# Patient Record
Sex: Female | Born: 1945 | Race: White | Hispanic: No | Smoking: Former smoker
Health system: Southern US, Community
[De-identification: ages and names within clinical notes are randomized; demographics above are authoritative.]

## PROBLEM LIST (undated history)

## (undated) DIAGNOSIS — M069 Rheumatoid arthritis, unspecified: Secondary | ICD-10-CM

## (undated) DIAGNOSIS — J849 Interstitial pulmonary disease, unspecified: Secondary | ICD-10-CM

## (undated) DIAGNOSIS — J8410 Pulmonary fibrosis, unspecified: Secondary | ICD-10-CM

## (undated) HISTORY — PX: TONSILLECTOMY: SUR1361

## (undated) HISTORY — PX: REPLACEMENT TOTAL KNEE BILATERAL: SUR1225

## (undated) HISTORY — PX: OTHER SURGICAL HISTORY: SHX169

---

## 2018-05-03 ENCOUNTER — Inpatient Hospital Stay
Admission: EM | Admit: 2018-05-03 | Discharge: 2018-05-07 | DRG: 813 | Disposition: A | Discharge status: Other Acute Inpatient Hospital | Payer: Medicare HMO | Attending: Internal Medicine | Admitting: Internal Medicine

## 2018-05-03 ENCOUNTER — Inpatient Hospital Stay: Payer: Medicare HMO

## 2018-05-03 ENCOUNTER — Other Ambulatory Visit: Payer: Self-pay

## 2018-05-03 ENCOUNTER — Emergency Department: Payer: Medicare HMO

## 2018-05-03 ENCOUNTER — Encounter: Payer: Self-pay | Admitting: Emergency Medicine

## 2018-05-03 DIAGNOSIS — R5383 Other fatigue: Secondary | ICD-10-CM | POA: Diagnosis not present

## 2018-05-03 DIAGNOSIS — J189 Pneumonia, unspecified organism: Secondary | ICD-10-CM | POA: Diagnosis present

## 2018-05-03 DIAGNOSIS — J8489 Other specified interstitial pulmonary diseases: Secondary | ICD-10-CM | POA: Diagnosis present

## 2018-05-03 DIAGNOSIS — D696 Thrombocytopenia, unspecified: Secondary | ICD-10-CM

## 2018-05-03 DIAGNOSIS — J984 Other disorders of lung: Secondary | ICD-10-CM

## 2018-05-03 DIAGNOSIS — J9601 Acute respiratory failure with hypoxia: Secondary | ICD-10-CM | POA: Diagnosis present

## 2018-05-03 DIAGNOSIS — R042 Hemoptysis: Secondary | ICD-10-CM | POA: Diagnosis present

## 2018-05-03 DIAGNOSIS — D649 Anemia, unspecified: Secondary | ICD-10-CM | POA: Diagnosis present

## 2018-05-03 DIAGNOSIS — M069 Rheumatoid arthritis, unspecified: Secondary | ICD-10-CM | POA: Diagnosis present

## 2018-05-03 DIAGNOSIS — Z8701 Personal history of pneumonia (recurrent): Secondary | ICD-10-CM | POA: Diagnosis not present

## 2018-05-03 DIAGNOSIS — E538 Deficiency of other specified B group vitamins: Secondary | ICD-10-CM

## 2018-05-03 DIAGNOSIS — D693 Immune thrombocytopenic purpura: Secondary | ICD-10-CM

## 2018-05-03 DIAGNOSIS — R918 Other nonspecific abnormal finding of lung field: Secondary | ICD-10-CM | POA: Diagnosis not present

## 2018-05-03 DIAGNOSIS — Z87891 Personal history of nicotine dependence: Secondary | ICD-10-CM | POA: Diagnosis not present

## 2018-05-03 DIAGNOSIS — J8410 Pulmonary fibrosis, unspecified: Secondary | ICD-10-CM | POA: Diagnosis present

## 2018-05-03 DIAGNOSIS — F418 Other specified anxiety disorders: Secondary | ICD-10-CM | POA: Diagnosis present

## 2018-05-03 DIAGNOSIS — K219 Gastro-esophageal reflux disease without esophagitis: Secondary | ICD-10-CM | POA: Diagnosis present

## 2018-05-03 DIAGNOSIS — R05 Cough: Secondary | ICD-10-CM | POA: Diagnosis not present

## 2018-05-03 DIAGNOSIS — R059 Cough, unspecified: Secondary | ICD-10-CM

## 2018-05-03 DIAGNOSIS — R531 Weakness: Secondary | ICD-10-CM | POA: Diagnosis not present

## 2018-05-03 DIAGNOSIS — G2581 Restless legs syndrome: Secondary | ICD-10-CM | POA: Diagnosis present

## 2018-05-03 DIAGNOSIS — Z8042 Family history of malignant neoplasm of prostate: Secondary | ICD-10-CM | POA: Diagnosis not present

## 2018-05-03 DIAGNOSIS — R21 Rash and other nonspecific skin eruption: Secondary | ICD-10-CM

## 2018-05-03 DIAGNOSIS — Z791 Long term (current) use of non-steroidal anti-inflammatories (NSAID): Secondary | ICD-10-CM

## 2018-05-03 DIAGNOSIS — Z8051 Family history of malignant neoplasm of kidney: Secondary | ICD-10-CM

## 2018-05-03 DIAGNOSIS — Z79899 Other long term (current) drug therapy: Secondary | ICD-10-CM | POA: Diagnosis not present

## 2018-05-03 DIAGNOSIS — Z96653 Presence of artificial knee joint, bilateral: Secondary | ICD-10-CM | POA: Diagnosis present

## 2018-05-03 HISTORY — DX: Rheumatoid arthritis, unspecified: M06.9

## 2018-05-03 HISTORY — DX: Interstitial pulmonary disease, unspecified: J84.9

## 2018-05-03 HISTORY — DX: Pulmonary fibrosis, unspecified: J84.10

## 2018-05-03 LAB — COMPREHENSIVE METABOLIC PANEL
ALBUMIN: 4 g/dL (ref 3.5–5.0)
ALK PHOS: 71 U/L (ref 38–126)
ALT: 20 U/L (ref 14–54)
ANION GAP: 6 (ref 5–15)
AST: 28 U/L (ref 15–41)
BUN: 17 mg/dL (ref 6–20)
CALCIUM: 8.5 mg/dL — AB (ref 8.9–10.3)
CO2: 22 mmol/L (ref 22–32)
Chloride: 113 mmol/L — ABNORMAL HIGH (ref 101–111)
Creatinine, Ser: 0.83 mg/dL (ref 0.44–1.00)
GFR calc Af Amer: >60 mL/min (ref >60)
GFR calc non Af Amer: >60 mL/min (ref >60)
GLUCOSE: 115 mg/dL — AB (ref 65–99)
Potassium: 3.8 mmol/L (ref 3.5–5.1)
SODIUM: 141 mmol/L (ref 135–145)
Total Bilirubin: 0.7 mg/dL (ref 0.3–1.2)
Total Protein: 6.3 g/dL — ABNORMAL LOW (ref 6.5–8.1)

## 2018-05-03 LAB — CBC WITH DIFFERENTIAL/PLATELET
BASOS ABS: 0 10*3/uL (ref 0–0.1)
Basophils Relative: 1 %
Eosinophils Absolute: 0 10*3/uL (ref 0–0.7)
Eosinophils Relative: 0 %
HEMATOCRIT: 37.4 % (ref 35.0–47.0)
Hemoglobin: 12.5 g/dL (ref 12.0–16.0)
LYMPHS PCT: 16 %
Lymphs Abs: 1.6 10*3/uL (ref 1.0–3.6)
MCH: 29.6 pg (ref 26.0–34.0)
MCHC: 33.5 g/dL (ref 32.0–36.0)
MCV: 88.5 fL (ref 80.0–100.0)
MONO ABS: 0.7 10*3/uL (ref 0.2–0.9)
Monocytes Relative: 7 %
NEUTROS ABS: 7.7 10*3/uL — AB (ref 1.4–6.5)
Neutrophils Relative %: 76 %
Platelets: <5 10*3/uL — CL (ref 150–440)
RBC: 4.22 MIL/uL (ref 3.80–5.20)
RDW: 13.9 % (ref 11.5–14.5)
WBC: 10.1 10*3/uL (ref 3.6–11.0)

## 2018-05-03 LAB — APTT: aPTT: 27 seconds (ref 24–36)

## 2018-05-03 LAB — PROTIME-INR
INR: 0.97
Prothrombin Time: 12.8 seconds (ref 11.4–15.2)

## 2018-05-03 LAB — TYPE AND SCREEN
ABO/RH(D): O POS
Antibody Screen: NEGATIVE

## 2018-05-03 LAB — TECHNOLOGIST SMEAR REVIEW

## 2018-05-03 MED ORDER — PANTOPRAZOLE SODIUM 40 MG PO TBEC
40.0000 mg | DELAYED_RELEASE_TABLET | Freq: Every day | ORAL | Status: DC
  Administered 2018-05-04 – 2018-05-06 (×3): 40 mg via ORAL
  Filled 2018-05-03 (×3): qty 1

## 2018-05-03 MED ORDER — ONDANSETRON HCL 4 MG/2ML IJ SOLN: 4.0000 mg | Freq: Four times a day (QID) | INTRAMUSCULAR | Status: DC | PRN

## 2018-05-03 MED ORDER — ROPINIROLE HCL 1 MG PO TABS
4.0000 mg | ORAL_TABLET | Freq: Every day | ORAL | Status: DC
  Administered 2018-05-03 – 2018-05-06 (×4): 4 mg via ORAL
  Filled 2018-05-03 (×4): qty 4

## 2018-05-03 MED ORDER — TRAZODONE HCL 50 MG PO TABS
50.0000 mg | ORAL_TABLET | Freq: Every day | ORAL | Status: DC
  Administered 2018-05-03 – 2018-05-06 (×4): 50 mg via ORAL
  Filled 2018-05-03 (×5): qty 1

## 2018-05-03 MED ORDER — SODIUM CHLORIDE 0.9% FLUSH: 3.0000 mL | INTRAVENOUS | Status: DC | PRN

## 2018-05-03 MED ORDER — SODIUM CHLORIDE 0.9 % IV SOLN
10.0000 mL/h | Freq: Once | INTRAVENOUS | Status: AC
  Administered 2018-05-03: 16:00:00 10 mL/h via INTRAVENOUS

## 2018-05-03 MED ORDER — IOHEXOL 350 MG/ML SOLN
75.0000 mL | Freq: Once | INTRAVENOUS | Status: AC | PRN
  Administered 2018-05-03: 75 mL via INTRAVENOUS

## 2018-05-03 MED ORDER — ACETAMINOPHEN 325 MG PO TABS
650.0000 mg | ORAL_TABLET | Freq: Four times a day (QID) | ORAL | Status: DC | PRN
Start: 2018-05-03 — End: 2018-05-07
  Administered 2018-05-05 – 2018-05-06 (×2): 650 mg via ORAL
  Filled 2018-05-03 (×2): qty 2

## 2018-05-03 MED ORDER — GABAPENTIN 600 MG PO TABS
600.0000 mg | ORAL_TABLET | Freq: Two times a day (BID) | ORAL | Status: DC
  Administered 2018-05-03 – 2018-05-06 (×7): 600 mg via ORAL
  Filled 2018-05-03 (×7): qty 1

## 2018-05-03 MED ORDER — DULOXETINE HCL 30 MG PO CPEP
60.0000 mg | ORAL_CAPSULE | Freq: Every day | ORAL | Status: DC
  Administered 2018-05-04 – 2018-05-06 (×3): 60 mg via ORAL
  Filled 2018-05-03 (×3): qty 2

## 2018-05-03 MED ORDER — ALBUTEROL SULFATE (2.5 MG/3ML) 0.083% IN NEBU: 2.5000 mg | INHALATION_SOLUTION | RESPIRATORY_TRACT | Status: DC | PRN

## 2018-05-03 MED ORDER — TRAMADOL HCL 50 MG PO TABS: 50.0000 mg | ORAL_TABLET | Freq: Four times a day (QID) | ORAL | Status: DC | PRN

## 2018-05-03 MED ORDER — ONDANSETRON HCL 4 MG PO TABS
4.0000 mg | ORAL_TABLET | Freq: Four times a day (QID) | ORAL | Status: DC | PRN
Start: 2018-05-03 — End: 2018-05-07

## 2018-05-03 MED ORDER — SODIUM CHLORIDE 0.9 % IV SOLN
250.0000 mL | INTRAVENOUS | Status: DC | PRN
  Administered 2018-05-03: 250 mL via INTRAVENOUS

## 2018-05-03 MED ORDER — SODIUM CHLORIDE 0.9% FLUSH
3.0000 mL | Freq: Two times a day (BID) | INTRAVENOUS | Status: DC
  Administered 2018-05-03 – 2018-05-06 (×8): 3 mL via INTRAVENOUS

## 2018-05-03 MED ORDER — IMMUNE GLOBULIN (HUMAN) 20 GM/200ML IV SOLN
90.0000 g | INTRAVENOUS | Status: DC
  Filled 2018-05-03: qty 100

## 2018-05-03 MED ORDER — IMMUNE GLOBULIN (HUMAN) 10 GM/100ML IV SOLN
1.0000 g/kg | INTRAVENOUS | Status: DC
  Filled 2018-05-03: qty 1000

## 2018-05-03 MED ORDER — BUPROPION HCL ER (XL) 300 MG PO TB24
300.0000 mg | ORAL_TABLET | Freq: Every day | ORAL | Status: DC
Start: 2018-05-04 — End: 2018-05-07
  Administered 2018-05-04 – 2018-05-06 (×3): 300 mg via ORAL
  Filled 2018-05-03 (×4): qty 1

## 2018-05-03 MED ORDER — SENNOSIDES-DOCUSATE SODIUM 8.6-50 MG PO TABS: 1.0000 | ORAL_TABLET | Freq: Every evening | ORAL | Status: DC | PRN

## 2018-05-03 MED ORDER — ACETAMINOPHEN 650 MG RE SUPP
650.0000 mg | Freq: Four times a day (QID) | RECTAL | Status: DC | PRN
Start: 2018-05-03 — End: 2018-05-07

## 2018-05-03 MED ORDER — BISACODYL 5 MG PO TBEC
5.0000 mg | DELAYED_RELEASE_TABLET | Freq: Every day | ORAL | Status: DC | PRN
  Administered 2018-05-04 – 2018-05-05 (×2): 5 mg via ORAL
  Filled 2018-05-03 (×2): qty 1

## 2018-05-03 MED ORDER — METHYLPREDNISOLONE SODIUM SUCC 125 MG IJ SOLR
1.0000 mg/kg | Freq: Once | INTRAMUSCULAR | Status: AC
  Administered 2018-05-03: 19:00:00 91.875 mg via INTRAVENOUS
  Filled 2018-05-03: qty 2

## 2018-05-03 MED ORDER — IMMUNE GLOBULIN (HUMAN) 20 GM/200ML IV SOLN
90.0000 g | INTRAVENOUS | Status: AC
  Administered 2018-05-03 – 2018-05-04 (×2): 90 g via INTRAVENOUS
  Filled 2018-05-03 (×2): qty 100

## 2018-05-03 MED ORDER — TOPIRAMATE 25 MG PO TABS
50.0000 mg | ORAL_TABLET | Freq: Two times a day (BID) | ORAL | Status: DC
Start: 2018-05-03 — End: 2018-05-07
  Administered 2018-05-03 – 2018-05-06 (×7): 50 mg via ORAL
  Filled 2018-05-03 (×8): qty 2

## 2018-05-03 NOTE — Discharge Instructions (Signed)
Follow-up with your doctor when you return home.  Return to the emergency room if you develop any more episodes of coughing up blood, chest pain or shortness of breath as these may be sign of a more serious condition.

## 2018-05-03 NOTE — ED Notes (Signed)
Attempted to call report x 1  

## 2018-05-03 NOTE — ED Notes (Signed)
Admitting MD at bedside at this time.

## 2018-05-03 NOTE — ED Notes (Signed)
Date and time results received: 05/03/18 1238 (use smartphrase ".now" to insert current time)  Test: Platelett Critical Value: 2  Name of Provider Notified: Don Perking  Orders Received? Or Actions Taken?: See orders for details.

## 2018-05-03 NOTE — ED Notes (Signed)
Pt taken to CT at this time.

## 2018-05-03 NOTE — H&P (Signed)
[facility] - [place] at [facility]   PATIENT NAME: Natasha Shaffer    MR#:  [id number]  DATE OF BIRTH:  1946-05-18  DATE OF ADMISSION:  05/03/2018  PRIMARY CARE PHYSICIAN: System, Provider Not In   REQUESTING/REFERRING PHYSICIAN: Nita Sickle, MD  CHIEF COMPLAINT:   Chief Complaint  Patient presents with  . Hemoptysis   Hemoptysis today. HISTORY OF PRESENT ILLNESS:  Natasha Shaffer  is a 72 y.o. female with a known history of interstitial lung disease, pulmonary fibrosis and rheumatoid arthritis.  The patient is visiting here from [place].  She had hematemesis this morning which include small blood clot.  She also complains of painful sore in her tongue for the last few days.  [place] at the airport and has some bruises on the right side.  She denies any chest pain, palpitation or shortness of breath.  She denies any melena, bloody stool or hematuria.  She took a flight from [place] to [place] which is about 1.5 hours only.  CT angiogram did not show any PE..  But CBC showed platelet is less than 5000.  ED physician discussed with on-call oncologist Dr. Cathie Hoops, who suggested starting IV Solu-Medrol and platelet transfusion. PAST MEDICAL HISTORY:   Past Medical History:  Diagnosis Date  . Interstitial lung disease (HCC)   . Pulmonary fibrosis (HCC)   . Rheumatoid arthritis (HCC)     PAST SURGICAL HISTORY:   Past Surgical History:  Procedure Laterality Date  . LIGATE FALLOPIAN TUBE    . REPLACEMENT TOTAL KNEE BILATERAL    . TONSILLECTOMY      SOCIAL HISTORY:   Social History   Tobacco Use  . Smoking status: Not on file  Substance Use Topics  . Alcohol use: Not on file    FAMILY HISTORY:   Family History  Problem Relation Age of Onset  . Renal cancer Mother   . Prostate cancer Father   . Heart failure Father     DRUG ALLERGIES:   Allergies  Allergen Reactions  . Codeine Other (See Comments)    "feels bad"  . Etomidate Other (See  Comments)    "muscle twitches"    REVIEW OF SYSTEMS:   Review of Systems  Constitutional: Negative for chills, fever and malaise/fatigue.  HENT: Negative for sore throat.   Eyes: Negative for blurred vision and double vision.  Respiratory: Positive for hemoptysis and sputum production. Negative for cough, shortness of breath, wheezing and stridor.   Cardiovascular: Negative for chest pain, palpitations, orthopnea and leg swelling.  Gastrointestinal: Negative for abdominal pain, blood in stool, diarrhea, melena, nausea and vomiting.  Genitourinary: Negative for dysuria, flank pain and hematuria.  Musculoskeletal: Negative for back pain and joint pain.  Skin: Positive for rash.  Neurological: Negative for dizziness, sensory change, focal weakness, seizures, loss of consciousness, weakness and headaches.  Endo/Heme/Allergies: Negative for polydipsia.  Psychiatric/Behavioral: Negative for depression. The patient is not nervous/anxious.     MEDICATIONS AT HOME:   Prior to Admission medications   Medication Sig Start Date End Date Taking? Authorizing Provider  atorvastatin (LIPITOR) 80 MG tablet Take 80 mg by mouth at bedtime. 04/08/18  Yes [provider]  betamethasone valerate ointment (VALISONE) 0.1 % Apply 1 application topically at bedtime. Apply thin layer to affected areas on legs. 03/26/18  Yes [provider]  buPROPion (WELLBUTRIN XL) 300 MG 24 hr tablet Take 300 mg by mouth daily. 02/08/18  Yes [provider]  DULoxetine (CYMBALTA) 60  MG capsule Take 60 mg by mouth daily. 02/24/18  Yes [provider]  gabapentin (NEURONTIN) 600 MG tablet Take 600 mg by mouth 2 (two) times daily. 02/17/18  Yes [provider]  meloxicam (MOBIC) 15 MG tablet Take 15 mg by mouth daily. 02/11/18  Yes [provider]  omeprazole (PRILOSEC) 20 MG capsule Take 20 mg by mouth daily. 04/08/18  Yes [provider]  rOPINIRole (REQUIP) 4 MG tablet  Take 4 mg by mouth at bedtime. 04/04/18  Yes [provider]  sulfaSALAzine (AZULFIDINE) 500 MG tablet Take 1,000 mg by mouth 2 (two) times daily. 01/31/18  Yes [provider]  topiramate (TOPAMAX) 50 MG tablet Take 50 mg by mouth 2 (two) times daily. 04/27/18  Yes [provider]  traZODone (DESYREL) 50 MG tablet Take 50 mg by mouth at bedtime. 02/21/18  Yes [provider]      VITAL SIGNS:  Blood pressure (!) 146/82, pulse 89, temperature 98.3 F (36.8 C), temperature source Oral, resp. rate (!) 21, height 5\' 5"  (1.651 m), weight 203 lb (92.1 kg), SpO2 94 %.  PHYSICAL EXAMINATION:  Physical Exam  GENERAL:  72 y.o.-year-old patient lying in the bed with no acute distress.  EYES: Pupils equal, round, reactive to light and accommodation. No scleral icterus. Extraocular muscles intact.  HEENT: Head atraumatic, normocephalic. Oropharynx and nasopharynx clear.  NECK:  Supple, no jugular venous distention. No thyroid enlargement, no tenderness.  LUNGS: Normal breath sounds bilaterally, no wheezing, rales,rhonchi or crepitation. No use of accessory muscles of respiration.  CARDIOVASCULAR: S1, S2 normal. No murmurs, rubs, or gallops.  ABDOMEN: Soft, nontender, nondistended. Bowel sounds present. No organomegaly or mass.  EXTREMITIES: No pedal edema, cyanosis, or clubbing.  NEUROLOGIC: Cranial nerves II through XII are intact. Muscle strength 5/5 in all extremities. Sensation intact. Gait not checked.  PSYCHIATRIC: The patient is alert and oriented x 3.  SKIN: Diffuse petechial rash on bilateral LE. Some bruises.  LABORATORY PANEL:   CBC Recent Labs  Lab 05/03/18 1209  WBC 10.1  HGB 12.5  HCT 37.4  PLT <5*   ------------------------------------------------------------------------------------------------------------------  Chemistries  Recent Labs  Lab 05/03/18 1050  NA 141  K 3.8  CL 113*  CO2 22  GLUCOSE 115*  BUN 17  CREATININE 0.83    CALCIUM 8.5*  AST 28  ALT 20  ALKPHOS 71  BILITOT 0.7   ------------------------------------------------------------------------------------------------------------------  Cardiac Enzymes No results for input(s): TROPONINI in the last 168 hours. ------------------------------------------------------------------------------------------------------------------  RADIOLOGY:  Dg Chest 2 View  Result Date: 05/03/2018 CLINICAL DATA:  Hemoptysis. No chest pain or shortness of breath. History of rheumatoid arthritis and pulmonary fibrosis. EXAM: CHEST - 2 VIEW COMPARISON:  None. FINDINGS: Pulse generator device in the right chest wall. Mild cardiomegaly. Normal pulmonary vascularity. Mild interstitial thickening at the lung bases. No focal consolidation, pleural effusion, or pneumothorax. No acute osseous abnormality. IMPRESSION: 1. Mild interstitial thickening at the lung bases likely reflects interstitial lung disease given clinical history. No definite active cardiopulmonary disease. Electronically Signed   By: 05/05/2018 M.D.   On: 05/03/2018 12:09   Ct Angio Chest Pe W And/or Wo Contrast  Result Date: 05/03/2018 CLINICAL DATA:  Hemoptysis. Known pulmonary fibrosis and rheumatoid arthritis. Chronic cough. EXAM: CT ANGIOGRAPHY CHEST WITH CONTRAST TECHNIQUE: Multidetector CT imaging of the chest was performed using the standard protocol during bolus administration of intravenous contrast. Multiplanar CT image reconstructions and MIPs were obtained to evaluate the vascular anatomy. CONTRAST:  78mL  OMNIPAQUE IOHEXOL 350 MG/ML SOLN COMPARISON:  None. FINDINGS: Cardiovascular: Mild cardiomegaly. Mild calcified plaque over the left anterior descending coronary artery. Minimal calcified plaque over the thoracic aorta. Pulmonary arterial system is normal without evidence of emboli. Remaining vascular structures are unremarkable. Mediastinum/Nodes: No evidence of mediastinal or hilar adenopathy. Possible  tiny hiatal hernia. Remaining mediastinal structures are within normal. Lungs/Pleura: Lungs are adequately inflated demonstrate a patchy bilateral hazy nodular airspace process likely due to infection. Inflammatory or neoplastic process is possible. Mild peripheral interstitial changes over the lower lobes. No effusion. Calcification versus surgical sutures along the left hemidiaphragm. Airways are within normal. Upper Abdomen: Minimal calcified plaque over the abdominal aorta. Musculoskeletal: Mild degenerate change of the spine. Review of the MIP images confirms the above findings. IMPRESSION: No evidence of pulmonary embolism. Patchy bilateral hazy nodular airspace process likely due to infection. Inflammatory or neoplastic disease is less likely. Recommend follow-up to resolution. Mild interstitial changes within the lung bases. Mild cardiomegaly. Minimal atherosclerotic coronary artery disease. Aortic Atherosclerosis (ICD10-I70.0). Electronically Signed   By: Elberta Fortis M.D.   On: 05/03/2018 13:45      IMPRESSION AND PLAN:   Severe thrombocytopenia with hemoptysis The patient will be admitted to medical floor. Start IV cell Medrol, platelet transfusion and to follow-up CBC. Follow-up hematologist consult.  Interstitial lung disease and pulmonary fibrosis.  Stable.  All the records are reviewed and case discussed with ED provider. Management plans discussed with the patient, family and they are in agreement.  CODE STATUS: Full code  TOTAL TIME TAKING CARE OF THIS PATIENT:53 minutes.    Shaune Pollack M.D on 05/03/2018 at 2:14 PM  Between 7am to 6pm - Pager - [phone]  After 6pm go to [email or web address] - Social research officer, government  [facility] [place] [facility]  Office  [phone]  CC: Primary care physician; System, Provider Not In   Note: This dictation was prepared with Dragon dictation along with smaller phrase technology. Any transcriptional errors that result from this  process are unin

## 2018-05-03 NOTE — Consult Note (Signed)
Hematology/Oncology Consult note [facility] Telephone:([phone] [phone]  Patient Care Team: System, Provider Not In as PCP - General   Name of the patient: Natasha Shaffer  [id number]  03/15/1946   Date of visit: 05/03/18 REASON FOR COSULTATION:  Thrombocytopenia History of presenting illness-  72 y.o. female with PMH listed at below who presents to ER for evaluation of hemoptysis. He has a history of interstitial lung disease, pulmonary fibrosis and rheumatoid arthritis.  She lives in [place] and visiting friends in [place].  She reports waking up this morning and noticed some dried blood on her lips.  Also reports bringing back some blood clots from her throat. She has a history of pulmonary fibrosis and have chronic cough which is not worse.  Denies any shortness of breath or chest pain. She has also noticed some lower extremity" rash" bilaterally for a few days.  Also has scattered bruising on her right hip, right elbow after a mechanical fall recently. Denies any fever or chills, dark urine, denies any headache, melena, blood in stool.  Chronic fatigue stable. Denies any previous similar episodes of low platelet counts. she takes sulfasalazine  for rheumatoid arthritis.    Review of Systems  Constitutional: Positive for malaise/fatigue. Negative for chills, fever and weight loss.  HENT: Negative for congestion, ear discharge, ear pain and nosebleeds.   Eyes: Negative for double vision, photophobia, pain and discharge.  Respiratory: Positive for cough and hemoptysis. Negative for shortness of breath and wheezing.   Cardiovascular: Negative for chest pain, palpitations and orthopnea.  Gastrointestinal: Negative for abdominal pain, blood in stool, constipation, heartburn, melena and vomiting.  Genitourinary: Negative for dysuria, flank pain, frequency and hematuria.  Musculoskeletal: Negative for back pain, myalgias and neck pain.    Skin: Positive for rash. Negative for itching.       Bilateral lower extremities rash,   Neurological: Negative for dizziness, tingling, tremors, focal weakness, weakness and headaches.  Endo/Heme/Allergies: Negative for environmental allergies. Bruises/bleeds easily.  Psychiatric/Behavioral: Negative for depression and hallucinations. The patient is not nervous/anxious.     Allergies  Allergen Reactions  . Codeine Other (See Comments)    "feels bad"  . Etomidate Other (See Comments)    "muscle twitches"    Patient Active Problem List   Diagnosis Date Noted  . Thrombocytopenia (HCC) 05/03/2018     Past Medical History:  Diagnosis Date  . Interstitial lung disease (HCC)   . Pulmonary fibrosis (HCC)   . Rheumatoid arthritis [facility])      Past Surgical History:  Procedure Laterality Date  . LIGATE FALLOPIAN TUBE    . REPLACEMENT TOTAL KNEE BILATERAL    . TONSILLECTOMY      Social History   Socioeconomic History  . Marital status: Widowed    Spouse name: Not on file  . Number of children: Not on file  . Years of education: Not on file  . Highest education level: Not on file  Occupational History  . Not on file  Social Needs  . Financial resource strain: Not on file  . Food insecurity:    Worry: Not on file    Inability: Not on file  . Transportation needs:    Medical: Not on file    Non-medical: Not on file  Tobacco Use  . Smoking status: Not on file  Substance and Sexual Activity  . Alcohol use: Not on file  . Drug use: Not on file  . Sexual activity: Not on file  Lifestyle  . Physical activity:    Days per week: Not on file    Minutes per session: Not on file  . Stress: Not on file  Relationships  . Social connections:    Talks on phone: Not on file    Gets together: Not on file    Attends religious service: Not on file    Active member of club or organization: Not on file    Attends meetings of clubs or organizations: Not on file    Relationship  status: Not on file  . Intimate partner violence:    Fear of current or ex partner: Not on file    Emotionally abused: Not on file    Physically abused: Not on file    Forced sexual activity: Not on file  Other Topics Concern  . Not on file  Social History Narrative  . Not on file     Family History  Problem Relation Age of Onset  . Renal cancer Mother   . Prostate cancer Father   . Heart failure Father      Current Facility-Administered Medications:  .  0.9 %  sodium chloride infusion, 250 mL, Intravenous, PRN, Shaune Pollack, MD .  acetaminophen (TYLENOL) tablet 650 mg, 650 mg, Oral, Q6H PRN **OR** acetaminophen (TYLENOL) suppository 650 mg, 650 mg, Rectal, Q6H PRN, Shaune Pollack, MD .  albuterol (PROVENTIL) (2.5 MG/3ML) 0.083% nebulizer solution 2.5 mg, 2.5 mg, Nebulization, Q2H PRN, Shaune Pollack, MD .  bisacodyl (DULCOLAX) EC tablet 5 mg, 5 mg, Oral, Daily PRN, Shaune Pollack, MD .  Melene Muller ON 05/04/2018] buPROPion (WELLBUTRIN XL) 24 hr tablet 300 mg, 300 mg, Oral, Daily, Shaune Pollack, MD .  Melene Muller ON 05/04/2018] DULoxetine (CYMBALTA) DR capsule 60 mg, 60 mg, Oral, Daily, Shaune Pollack, MD .  gabapentin (NEURONTIN) tablet 600 mg, 600 mg, Oral, BID, Shaune Pollack, MD .  Immune Globulin 10% (PRIVIGEN) IV infusion 95 g, 1 g/kg, Intravenous, Q24H, Rickard Patience, MD .  methylPREDNISolone sodium succinate (SOLU-MEDROL) 125 mg/2 mL injection 91.875 mg, 1 mg/kg, Intravenous, Once, Don Perking, [place], MD .  ondansetron [facility]) tablet 4 mg, 4 mg, Oral, Q6H PRN **OR** ondansetron (ZOFRAN) injection 4 mg, 4 mg, Intravenous, Q6H PRN, Shaune Pollack, MD .  Melene Muller ON 05/04/2018] pantoprazole (PROTONIX) EC tablet 40 mg, 40 mg, Oral, Daily, Shaune Pollack, MD .  rOPINIRole (REQUIP) tablet 4 mg, 4 mg, Oral, QHS, Shaune Pollack, MD .  senna-docusate (Senokot-S) tablet 1 tablet, 1 tablet, Oral, QHS PRN, Shaune Pollack, MD .  sodium chloride flush (NS) 0.9 % injection 3 mL, 3 mL, Intravenous, Q12H, Shaune Pollack, MD, 3 mL at 05/03/18 1451 .   sodium chloride flush (NS) 0.9 % injection 3 mL, 3 mL, Intravenous, PRN, Shaune Pollack, MD .  topiramate (TOPAMAX) tablet 50 mg, 50 mg, Oral, BID, Shaune Pollack, MD .  traZODone (DESYREL) tablet 50 mg, 50 mg, Oral, Algernon Huxley, MD   Physical exam: ECOG 1 Vitals:   05/03/18 1300 05/03/18 1345 05/03/18 1438 05/03/18 1548  BP: (!) 143/87 (!) 146/82 139/67 140/64  Pulse: 86 89 94 92  Resp: 14 (!) 21 20 20   Temp:   98.5 F (36.9 C) 99.3 F (37.4 C)  TempSrc:   Oral Oral  SpO2: 98% 94% 99% 96%  Weight:   208 lb (94.3 kg)   Height:   5\' 5"  (1.651 m)    Physical Exam  Constitutional: She is oriented to person, place, and time and well-developed, well-nourished, and in no distress.  No distress.  HENT:  Head: Normocephalic and atraumatic.  Nose: Nose normal.  Mouth/Throat: Oropharynx is clear and moist. No oropharyngeal exudate.  Eyes: Pupils are equal, round, and reactive to light. EOM are normal. No scleral icterus.  Neck: Normal range of motion. Neck supple.  Cardiovascular: Normal rate, regular rhythm and normal heart sounds.  No murmur heard. Pulmonary/Chest: Effort normal and breath sounds normal. No respiratory distress. She has no wheezes.  Abdominal: Soft. She exhibits no distension. There is no tenderness.  Musculoskeletal: Normal range of motion. She exhibits no edema or tenderness.  Lymphadenopathy:    She has no cervical adenopathy.  Neurological: She is alert and oriented to person, place, and time. No cranial nerve deficit. She exhibits normal muscle tone. Coordination normal.  Skin: Skin is warm and dry. She is not diaphoretic. No erythema.  Bilateral lower extremity scattered petechia Right thigh bruise/ecchymosis Right elbow bruise Right index finger petechia  Psychiatric: Affect and judgment normal.        CMP Latest Ref Rng & Units 05/03/2018  Glucose 65 - 99 mg/dL 885(O)  BUN 6 - 20 mg/dL 17  Creatinine 2.77 - 4.12 mg/dL 8.78  Sodium 676 - 720 mmol/L 141    Potassium 3.5 - 5.1 mmol/L 3.8  Chloride 101 - 111 mmol/L 113(H)  CO2 22 - 32 mmol/L 22  Calcium 8.9 - 10.3 mg/dL 9.4(B)  Total Protein 6.5 - 8.1 g/dL 6.3(L)  Total Bilirubin 0.3 - 1.2 mg/dL 0.7  Alkaline Phos 38 - 126 U/L 71  AST 15 - 41 U/L 28  ALT 14 - 54 U/L 20   CBC Latest Ref Rng & Units 05/03/2018  WBC 3.6 - 11.0 K/uL 10.1  Hemoglobin 12.0 - 16.0 g/dL 09.6  Hematocrit 28.3 - 47.0 % 37.4  Platelets 150 - 440 K/uL <5(LL)   RADIOGRAPHIC STUDIES: I have personally reviewed the radiological images as listed and agreed with the findings in the report. Dg Chest 2 View  Result Date: 05/03/2018 CLINICAL DATA:  Hemoptysis. No chest pain or shortness of breath. History of rheumatoid arthritis and pulmonary fibrosis. EXAM: CHEST - 2 VIEW COMPARISON:  None. FINDINGS: Pulse generator device in the right chest wall. Mild cardiomegaly. Normal pulmonary vascularity. Mild interstitial thickening at the lung bases. No focal consolidation, pleural effusion, or pneumothorax. No acute osseous abnormality. IMPRESSION: 1. Mild interstitial thickening at the lung bases likely reflects interstitial lung disease given clinical history. No definite active cardiopulmonary disease. Electronically Signed   By: Obie Dredge M.D.   On: 05/03/2018 12:09   Ct Angio Chest Pe W And/or Wo Contrast  Result Date: 05/03/2018 CLINICAL DATA:  Hemoptysis. Known pulmonary fibrosis and rheumatoid arthritis. Chronic cough. EXAM: CT ANGIOGRAPHY CHEST WITH CONTRAST TECHNIQUE: Multidetector CT imaging of the chest was performed using the standard protocol during bolus administration of intravenous contrast. Multiplanar CT image reconstructions and MIPs were obtained to evaluate the vascular anatomy. CONTRAST:  59mL OMNIPAQUE IOHEXOL 350 MG/ML SOLN COMPARISON:  None. FINDINGS: Cardiovascular: Mild cardiomegaly. Mild calcified plaque over the left anterior descending coronary artery. Minimal calcified plaque over the thoracic aorta.  Pulmonary arterial system is normal without evidence of emboli. Remaining vascular structures are unremarkable. Mediastinum/Nodes: No evidence of mediastinal or hilar adenopathy. Possible tiny hiatal hernia. Remaining mediastinal structures are within normal. Lungs/Pleura: Lungs are adequately inflated demonstrate a patchy bilateral hazy nodular airspace process likely due to infection. Inflammatory or neoplastic process is possible. Mild peripheral interstitial changes over the lower lobes. No effusion. Calcification versus  surgical sutures along the left hemidiaphragm. Airways are within normal. Upper Abdomen: Minimal calcified plaque over the abdominal aorta. Musculoskeletal: Mild degenerate change of the spine. Review of the MIP images confirms the above findings. IMPRESSION: No evidence of pulmonary embolism. Patchy bilateral hazy nodular airspace process likely due to infection. Inflammatory or neoplastic disease is less likely. Recommend follow-up to resolution. Mild interstitial changes within the lung bases. Mild cardiomegaly. Minimal atherosclerotic coronary artery disease. Aortic Atherosclerosis (ICD10-I70.0). Electronically Signed   By: Elberta Fortis M.D.   On: 05/03/2018 13:45    Assessment and plan- Patient is a 72 y.o. female with pulmonary fibrosis, rheumatoid arthritis on sulfasalazine presents with petechia, easy bruising, hemoptysis and found to have severe thrombocytopenia.  #Thrombocytopenia, platelet count < 5000. Normal WBC count and hemoglobin.  Discussed with hematology lab technician who reviewed patient's peripheral smear.  There is no immature cells, schistocytes, morphology unremarkable. 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  Discussed with emergency room physician.  Recommend starting patient with 1 mg/kg Solu-Medrol daily.  As her platelet is less than 10,000, she is at risk of spontaneous bleeding.  Recommend transfuse 1 unit of platelet to keep above 10,000 Start GI prophylaxis with proton pump  inhibitor. Also start patient with IVIG 1 g/kg x2 rationale and possible side effects discussed with patient.. She agrees with plan. Monitor CBC daily.  #Hemoptysis  Images were independently reviewed by me.  No evidence of pulmonary. Likely secondary to thrombocytopenia.   Thank you for allowing me to participate in the care of this patient.  Total face to face encounter time for this patient visit was 70 min. >50% of the time was  spent in counseling and coordination of care.    Rickard Patience, MD, PhD Hematology Oncology [facility] at [facility] Pager- [phone] 05/03/2018

## 2018-05-03 NOTE — ED Triage Notes (Signed)
States coughed with blood in sputum this am. States history of RA of lung and interstitial lung disease. Denies SOB. Denies fevers.

## 2018-05-03 NOTE — ED Provider Notes (Addendum)
[facility] Emergency Department Provider Note  ____________________________________________  Time seen: Approximately 12:22 PM  I have reviewed the triage vital signs and the nursing notes.   HISTORY  Chief Complaint Hemoptysis   HPI Natasha Shaffer is a 72 y.o. female with history of pulmonary fibrosis and rheumatoid arthritis who presents for evaluation of hemoptysis.  Patient reports that she woke up this morning and noted some dried blood on her lips.  She felt some mucus in the back of her throat and when she brought that up she noticed some blood specks on it.  She does report a mild cough however that is chronic for her and its dry from her interstitial lung disease.  She reports that she has not brought any blood in her sputum since this episode earlier this morning.  She has no chest pain or shortness of breath.  She is here from [place] visiting family.  She denies any prior history of blood clots.  The flight from [place] to [place] was 1.5 hours only.  She has no leg pain or swelling, she is not on exogenous hormones. She is complaining of a painful sore in her tongue for the last few days.   Past Medical History:  Diagnosis Date  . Interstitial lung disease (HCC)   . Pulmonary fibrosis (HCC)   . Rheumatoid arthritis (HCC)      Prior to Admission medications   Not on File    Allergies Codeine and Etomidate  FH Macular Degen Father    Social History Tobacco Use Types Packs/Day Years Used Date  Former Smoker Cigarettes 0.5 5 Quit: 11/02/2004  Smokeless Tobacco: Never Used      Tobacco Cessation: Counseling Given: No   Alcohol Use Drinks/Week oz/Week Comments  Yes   wine now and then    Review of Systems  Constitutional: Negative for fever. Eyes: Negative for visual changes. ENT: Negative for sore throat. + painful tongue sore Neck: No neck pain  Cardiovascular: Negative for chest pain. Respiratory: Negative for  shortness of breath. + hemoptysis Gastrointestinal: Negative for abdominal pain, vomiting or diarrhea. Genitourinary: Negative for dysuria. Musculoskeletal: Negative for back pain. Skin: Negative for rash. Neurological: Negative for headaches, weakness or numbness. Psych: No SI or HI  ____________________________________________   PHYSICAL EXAM:  VITAL SIGNS: ED Triage Vitals  Enc Vitals Group     BP 05/03/18 1037 (!) 150/85     Pulse Rate 05/03/18 1037 87     Resp 05/03/18 1037 18     Temp 05/03/18 1037 98.3 F (36.8 C)     Temp Source 05/03/18 1037 Oral     SpO2 05/03/18 1037 96 %     Weight 05/03/18 1039 203 lb (92.1 kg)     Height 05/03/18 1039 5\' 5"  (1.651 m)     Head Circumference --      Peak Flow --      Pain Score 05/03/18 1039 0     Pain Loc --      Pain Edu? --      Excl. in GC? --     Constitutional: Alert and oriented. Well appearing and in no apparent distress. HEENT:      Head: Normocephalic and atraumatic.         Eyes: Conjunctivae are normal. Sclera is non-icteric.       Mouth/Throat: Mucous membranes are moist. Lips have dry blood in them, tongue has several petechiae, there is an apthous ulcer on the R side  of her tongue and several small little abrasions on the roof of the mouth one a little larger measuring 0.25cm which shows stigmata of recent bleed.      Neck: Supple with no signs of meningismus. Cardiovascular: Regular rate and rhythm. No murmurs, gallops, or rubs. 2+ symmetrical distal pulses are present in all extremities. No JVD. Respiratory: Normal respiratory effort. Lungs are clear to auscultation bilaterally. No wheezes, crackles, or rhonchi.  Gastrointestinal: Soft, non tender, and non distended with positive bowel sounds. No rebound or guarding. Musculoskeletal: Nontender with normal range of motion in all extremities. No edema, cyanosis, or erythema of extremities. Neurologic: Normal speech and language. Face is symmetric. Moving all  extremities. No gross focal neurologic deficits are appreciated. Skin: Skin is warm, dry and intact. Diffuse petechial rash on bilateral LE. Patient has several bruises throughout her body Psychiatric: Mood and affect are normal. Speech and behavior are normal.  ____________________________________________   LABS (all labs ordered are listed, but only abnormal results are displayed)  Labs Reviewed  COMPREHENSIVE METABOLIC PANEL - Abnormal; Notable for the following components:      Result Value   Chloride 113 (*)    Glucose, Bld 115 (*)    Calcium 8.5 (*)    Total Protein 6.3 (*)    All other components within normal limits  CBC WITH DIFFERENTIAL/PLATELET - Abnormal; Notable for the following components:   Platelets <5 (*)    Neutro Abs 7.7 (*)    All other components within normal limits  APTT  PROTIME-INR  CBC WITH DIFFERENTIAL/PLATELET  PATHOLOGIST SMEAR REVIEW  TYPE AND SCREEN  PREPARE PLATELET PHERESIS   ____________________________________________  EKG  none  ____________________________________________  RADIOLOGY  I have personally reviewed the images performed during this visit and I agree with the Radiologist's read.   Interpretation by Radiologist:  Dg Chest 2 View  Result Date: 05/03/2018 CLINICAL DATA:  Hemoptysis. No chest pain or shortness of breath. History of rheumatoid arthritis and pulmonary fibrosis. EXAM: CHEST - 2 VIEW COMPARISON:  None. FINDINGS: Pulse generator device in the right chest wall. Mild cardiomegaly. Normal pulmonary vascularity. Mild interstitial thickening at the lung bases. No focal consolidation, pleural effusion, or pneumothorax. No acute osseous abnormality. IMPRESSION: 1. Mild interstitial thickening at the lung bases likely reflects interstitial lung disease given clinical history. No definite active cardiopulmonary disease. Electronically Signed   By: Obie Dredge M.D.   On: 05/03/2018 12:09      ____________________________________________   PROCEDURES  Procedure(s) performed: None Procedures Critical Care performed:  Yes  CRITICAL CARE Performed by: Nita Sickle  ?  Total critical care time: 30 min  Critical care time was exclusive of separately billable procedures and treating other patients.  Critical care was necessary to treat or prevent imminent or life-threatening deterioration.  Critical care was time spent personally by me on the following activities: development of treatment plan with patient and/or surrogate as well as nursing, discussions with consultants, evaluation of patient's response to treatment, examination of patient, obtaining history from patient or surrogate, ordering and performing treatments and interventions, ordering and review of laboratory studies, ordering and review of radiographic studies, pulse oximetry and re-evaluation of patient's condition.  ____________________________________________   INITIAL IMPRESSION / ASSESSMENT AND PLAN / ED COURSE  72 y.o. female with history of pulmonary fibrosis and rheumatoid arthritis who presents for evaluation of hemoptysis. She woke up this am with dry blood in her lips and collection of phlegm in the back of her throat which  had blood specks on it. She does have several lesions consistent with possible trauma in her mouth, one larger which seems to have bled recently and potentially the source of the blood. She reports eating hard candy yesterday which could have caused these lesions. However more concerning is the fact that she has petechiae in her tongue and her platelets are currently 2. She has no h/o thrombocytopenia and her platelets were normal in 01/19. At this time, patient meets criteria for inpatient admission for further evaluation of possible ITP, malignancy, HIV. Will get CTA to rule out PE    _________________________ 1:16 PM on  05/03/2018 -----------------------------------------  PTT, PT, INR and all other cell lines are WNL. Discussed with Dr. Janyth Contes, hematologist who recommended starting patient on Solu-Medrol 1 mg/kg, had a pathology smear review which I had already ordered, transfuse platelets for a platelet count of 10K, and admit patient for monitoring.  Discussed with the hospitalist for admission.   As part of my medical decision making, I reviewed the following data within the electronic MEDICAL RECORD NUMBER Nursing notes reviewed and incorporated, Labs reviewed , Old chart reviewed, Radiograph reviewed , Notes from prior ED visits and  Controlled Substance Database    Pertinent labs & imaging results that were available during my care of the patient were reviewed by me and considered in my medical decision making (see chart for details).    ____________________________________________   FINAL CLINICAL IMPRESSION(S) / ED DIAGNOSES  Final diagnoses:  Thrombocytopenia (HCC)  Hemoptysis      NEW MEDICATIONS STARTED DURING THIS VISIT:  ED Discharge Orders    None       Note:  This document was prepared using Dragon voice recognition software and may include unintentional dictation errors.    Don Perking, [place], MD 05/03/18 1233    Don Perking, [place], MD 05/03/18 1233    Don Perking, [place], MD 05/03/18 1317    Nita Sickle, MD 05/24/18 [phone]

## 2018-05-04 ENCOUNTER — Inpatient Hospital Stay: Payer: Medicare HMO

## 2018-05-04 DIAGNOSIS — Z87891 Personal history of nicotine dependence: Secondary | ICD-10-CM

## 2018-05-04 LAB — PREPARE PLATELET PHERESIS
UNIT DIVISION: 0
Unit division: 0

## 2018-05-04 LAB — BPAM PLATELET PHERESIS
BLOOD PRODUCT EXPIRATION DATE: 201906232359
BLOOD PRODUCT EXPIRATION DATE: 201906232359
ISSUE DATE / TIME: 201906221556
ISSUE DATE / TIME: 201906221853
UNIT TYPE AND RH: 5100
Unit Type and Rh: 5100

## 2018-05-04 LAB — CBC
HCT: 33.7 % — ABNORMAL LOW (ref 35.0–47.0)
Hemoglobin: 11.2 g/dL — ABNORMAL LOW (ref 12.0–16.0)
MCH: 29.4 pg (ref 26.0–34.0)
MCHC: 33.3 g/dL (ref 32.0–36.0)
MCV: 88.2 fL (ref 80.0–100.0)
PLATELETS: 5 10*3/uL — AB (ref 150–440)
RBC: 3.82 MIL/uL (ref 3.80–5.20)
RDW: 14.1 % (ref 11.5–14.5)
WBC: 11 10*3/uL (ref 3.6–11.0)

## 2018-05-04 LAB — FOLATE: Folate: 9 ng/mL (ref >5.9)

## 2018-05-04 LAB — VITAMIN B12: Vitamin B-12: 277 pg/mL (ref 180–914)

## 2018-05-04 LAB — LACTATE DEHYDROGENASE: LDH: 230 U/L — AB (ref 98–192)

## 2018-05-04 LAB — PLATELET COUNT

## 2018-05-04 MED ORDER — METHYLPREDNISOLONE SODIUM SUCC 125 MG IJ SOLR
1.0000 mg/kg | Freq: Every day | INTRAMUSCULAR | Status: DC
  Administered 2018-05-04 – 2018-05-06 (×3): 94.375 mg via INTRAVENOUS
  Filled 2018-05-04 (×3): qty 2

## 2018-05-04 MED ORDER — SODIUM CHLORIDE 0.9% FLUSH: 3.0000 mL | INTRAVENOUS | Status: DC | PRN

## 2018-05-04 MED ORDER — DIPHENHYDRAMINE HCL 25 MG PO CAPS
25.0000 mg | ORAL_CAPSULE | Freq: Once | ORAL | Status: AC
  Administered 2018-05-04: 25 mg via ORAL
  Filled 2018-05-04: qty 1

## 2018-05-04 MED ORDER — SODIUM CHLORIDE 0.9% FLUSH: 10.0000 mL | INTRAVENOUS | Status: DC | PRN

## 2018-05-04 MED ORDER — SODIUM CHLORIDE 0.9 % IV SOLN
250.0000 mL | Freq: Once | INTRAVENOUS | Status: AC
  Administered 2018-05-04: 250 mL via INTRAVENOUS

## 2018-05-04 MED ORDER — ALPRAZOLAM 0.25 MG PO TABS
0.2500 mg | ORAL_TABLET | Freq: Three times a day (TID) | ORAL | Status: DC | PRN
Start: 2018-05-04 — End: 2018-05-07
  Administered 2018-05-04 – 2018-05-06 (×6): 0.25 mg via ORAL
  Filled 2018-05-04 (×6): qty 1

## 2018-05-04 NOTE — Progress Notes (Signed)
Patient ID: Natasha Shaffer, female   DOB: [birth year], 72 y.o.   MRN: [id number]  [facility] PROGRESS NOTE  Natasha Shaffer [id number] DOB: Apr 04, 1946 DOA: 05/03/2018 PCP: System, Provider Not In  HPI/Subjective: Patient here visiting from [facility] and ended up coming into the hospital secondary to spitting up blood.  Her platelets were found to be less than 5000.  Quite a bit of bruising on her body and petechiae in the legs.  Objective: Vitals:   05/04/18 0528 05/04/18 0909  BP: 136/74 (!) 146/69  Pulse: 89 87  Resp: 17   Temp: 98.3 F (36.8 C) 98.4 F (36.9 C)  SpO2: 93% 94%    Filed Weights   05/03/18 1039 05/03/18 1438  Weight: 92.1 kg (203 lb) 94.3 kg (208 lb)    ROS: Review of Systems  Constitutional: Negative for chills and fever.  Eyes: Negative for blurred vision.  Respiratory: Negative for cough and shortness of breath.   Cardiovascular: Negative for chest pain.  Gastrointestinal: Negative for abdominal pain, constipation, diarrhea, nausea and vomiting.  Genitourinary: Negative for dysuria.  Musculoskeletal: Negative for joint pain.  Neurological: Negative for dizziness and headaches.   Exam: Physical Exam  HENT:  Nose: No mucosal edema.  Mouth/Throat: No oropharyngeal exudate or posterior oropharyngeal edema.  Dried blood around the mouth  Eyes: Pupils are equal, round, and reactive to light. Conjunctivae, EOM and lids are normal.  Neck: No JVD present. Carotid bruit is not present. No edema present. No thyroid mass and no thyromegaly present.  Cardiovascular: S1 normal and S2 normal. Exam reveals no gallop.  No murmur heard. Pulses:      Dorsalis pedis pulses are 2+ on the right side, and 2+ on the left side.  Respiratory: No respiratory distress. She has no wheezes. She has no rhonchi. She has no rales.  GI: Soft. Bowel sounds are normal. There is no tenderness.  Musculoskeletal:       Right shoulder: She exhibits no swelling.  Lymphadenopathy:   She has no cervical adenopathy.  Neurological: She is alert. No cranial nerve deficit.  Skin: Skin is warm. Nails show no clubbing.  Petechiae lower extremities.  Bruising right hip and right elbow.  Some small bruises on the legs and arms.  Psychiatric: She has a normal mood and affect.      Data Reviewed: Basic Metabolic Panel: Recent Labs  Lab 05/03/18 1050  NA 141  K 3.8  CL 113*  CO2 22  GLUCOSE 115*  BUN 17  CREATININE 0.83  CALCIUM 8.5*   Liver Function Tests: Recent Labs  Lab 05/03/18 1050  AST 28  ALT 20  ALKPHOS 71  BILITOT 0.7  PROT 6.3*  ALBUMIN 4.0   CBC: Recent Labs  Lab 05/03/18 1209 05/04/18 0449  WBC 10.1 11.0  NEUTROABS 7.7*  --   HGB 12.5 11.2*  HCT 37.4 33.7*  MCV 88.5 88.2  PLT <5* 5*     Studies: Dg Chest 2 View  Result Date: 05/03/2018 CLINICAL DATA:  Hemoptysis. No chest pain or shortness of breath. History of rheumatoid arthritis and pulmonary fibrosis. EXAM: CHEST - 2 VIEW COMPARISON:  None. FINDINGS: Pulse generator device in the right chest wall. Mild cardiomegaly. Normal pulmonary vascularity. Mild interstitial thickening at the lung bases. No focal consolidation, pleural effusion, or pneumothorax. No acute osseous abnormality. IMPRESSION: 1. Mild interstitial thickening at the lung bases likely reflects interstitial lung disease given clinical history. No definite active cardiopulmonary disease. Electronically Signed   By: Chrissie Noa  Howell Pringle M.D.   On: 05/03/2018 12:09   Ct Angio Chest Pe W And/or Wo Contrast  Result Date: 05/03/2018 CLINICAL DATA:  Hemoptysis. Known pulmonary fibrosis and rheumatoid arthritis. Chronic cough. EXAM: CT ANGIOGRAPHY CHEST WITH CONTRAST TECHNIQUE: Multidetector CT imaging of the chest was performed using the standard protocol during bolus administration of intravenous contrast. Multiplanar CT image reconstructions and MIPs were obtained to evaluate the vascular anatomy. CONTRAST:  65mL OMNIPAQUE IOHEXOL  350 MG/ML SOLN COMPARISON:  None. FINDINGS: Cardiovascular: Mild cardiomegaly. Mild calcified plaque over the left anterior descending coronary artery. Minimal calcified plaque over the thoracic aorta. Pulmonary arterial system is normal without evidence of emboli. Remaining vascular structures are unremarkable. Mediastinum/Nodes: No evidence of mediastinal or hilar adenopathy. Possible tiny hiatal hernia. Remaining mediastinal structures are within normal. Lungs/Pleura: Lungs are adequately inflated demonstrate a patchy bilateral hazy nodular airspace process likely due to infection. Inflammatory or neoplastic process is possible. Mild peripheral interstitial changes over the lower lobes. No effusion. Calcification versus surgical sutures along the left hemidiaphragm. Airways are within normal. Upper Abdomen: Minimal calcified plaque over the abdominal aorta. Musculoskeletal: Mild degenerate change of the spine. Review of the MIP images confirms the above findings. IMPRESSION: No evidence of pulmonary embolism. Patchy bilateral hazy nodular airspace process likely due to infection. Inflammatory or neoplastic disease is less likely. Recommend follow-up to resolution. Mild interstitial changes within the lung bases. Mild cardiomegaly. Minimal atherosclerotic coronary artery disease. Aortic Atherosclerosis (ICD10-I70.0). Electronically Signed   By: Elberta Fortis M.D.   On: 05/03/2018 13:45    Scheduled Meds: . buPROPion  300 mg Oral Daily  . diphenhydrAMINE  25 mg Oral Once  . DULoxetine  60 mg Oral Daily  . gabapentin  600 mg Oral BID  . Immune Globulin 10%  90 g Intravenous Q24 Hr x 2  . methylPREDNISolone (SOLU-MEDROL) injection  1 mg/kg Intravenous Daily  . pantoprazole  40 mg Oral Daily  . rOPINIRole  4 mg Oral QHS  . sodium chloride flush  3 mL Intravenous Q12H  . topiramate  50 mg Oral BID  . traZODone  50 mg Oral QHS   Continuous Infusions: . sodium chloride Stopped (05/04/18 0036)  . sodium  chloride      Assessment/Plan:  1. Acute ITP with severe thrombocytopenia.  Patient ordered daily dosing of Solu-Medrol and also ordered immunoglobulin.  Hematology following.  Check platelet count on a daily basis.  As per hematology no schistocytes seen on peripheral smear. 2. History of rheumatoid arthritis and pulmonary fibrosis and interstitial lung disease.  Respiratory status stable. 3. GERD on Protonix 4. Restless leg syndrome on Requip 5. Anxiety depression continue psychiatric medications  Code Status:     Code Status Orders  (From admission, onward)        Start     Ordered   05/03/18 1432  Full code  Continuous     05/03/18 1431    Code Status History    This patient has a current code status but no historical code status.    Advance Directive Documentation     Most Recent Value  Type of Advance Directive  Healthcare Power of Attorney, Living will  Pre-existing out of facility DNR order (yellow form or pink MOST form)  -  "MOST" Form in Place?  -     Disposition Plan: Platelet count will have to be higher upon disposition.  Consultants:  Hematology  Time spent: 28 minutes  Bernadene Garside [employer]

## 2018-05-04 NOTE — Progress Notes (Signed)
Hematology/Oncology Progress Note [facility] [phone] [phone]  Patient Care Team: System, Provider Not In as PCP - General   Name of the patient: Natasha Shaffer  [id number]  [birth year]  Date of visit: 05/04/18   INTERVAL HISTORY-  No acute overnight events.  She got platelet transfusion yesterday and also got 1 dose of IVIG and 1 dose of Solu-Medrol yesterday. Reports having more bruises on her lower extremity.  Denies seeing any acute bleeding events.  Denies any headache.     Review of Systems  Constitutional: Negative for chills, fever, malaise/fatigue and weight loss.  HENT: Negative for congestion, ear discharge, ear pain and nosebleeds.   Eyes: Negative for double vision, photophobia and pain.  Respiratory: Negative for cough, hemoptysis, sputum production, shortness of breath and wheezing.   Cardiovascular: Negative for chest pain, palpitations, orthopnea, claudication and leg swelling.  Gastrointestinal: Negative for abdominal pain, blood in stool, heartburn, nausea and vomiting.  Genitourinary: Negative for dysuria, frequency and hematuria.  Musculoskeletal: Negative for back pain, myalgias and neck pain.  Skin: Negative for itching and rash.  Neurological: Negative for dizziness, tingling, tremors, focal weakness, weakness and headaches.  Endo/Heme/Allergies: Negative for environmental allergies. Bruises/bleeds easily.  Psychiatric/Behavioral: Negative for depression and hallucinations. The patient is not nervous/anxious.     Allergies  Allergen Reactions  . Codeine Other (See Comments)    "feels bad"  . Etomidate Other (See Comments)    "muscle twitches"    Patient Active Problem List   Diagnosis Date Noted  . Thrombocytopenia (HCC) 05/03/2018  . Hemoptysis   . Acute ITP (HCC)      Past Medical History:  Diagnosis Date  . Interstitial lung disease (HCC)   . Pulmonary fibrosis (HCC)   . Rheumatoid  arthritis [facility])      Past Surgical History:  Procedure Laterality Date  . LIGATE FALLOPIAN TUBE    . REPLACEMENT TOTAL KNEE BILATERAL    . TONSILLECTOMY      Social History   Socioeconomic History  . Marital status: Widowed    Spouse name: Not on file  . Number of children: Not on file  . Years of education: Not on file  . Highest education level: Not on file  Occupational History  . Not on file  Social Needs  . Financial resource strain: Not hard at all  . Food insecurity:    Worry: Never true    Inability: Never true  . Transportation needs:    Medical: No    Non-medical: No  Tobacco Use  . Smoking status: Former Smoker    Packs/day: 0.25    Years: 3.00    Pack years: 0.75    Types: Cigarettes    Start date: 12/23/2001    Last attempt to quit: 10/03/2005    Years since quitting: 12.5  . Smokeless tobacco: Never Used  Substance and Sexual Activity  . Alcohol use: Not on file  . Drug use: Not on file  . Sexual activity: Not Currently  Lifestyle  . Physical activity:    Days per week: Patient refused    Minutes per session: Patient refused  . Stress: To some extent  Relationships  . Social connections:    Talks on phone: More than three times a week    Gets together: Twice a week    Attends religious service: 1 to 4 times per year    Active member of club or organization: No    Attends meetings of  clubs or organizations: Never    Relationship status: Widowed  . Intimate partner violence:    Fear of current or ex partner: No    Emotionally abused: No    Physically abused: No    Forced sexual activity: No  Other Topics Concern  . Not on file  Social History Narrative  . Not on file     Family History  Problem Relation Age of Onset  . Renal cancer Mother   . Prostate cancer Father   . Heart failure Father      Current Facility-Administered Medications:  .  0.9 %  sodium chloride infusion, 250 mL, Intravenous, PRN, Shaune Pollack, MD, Stopped at  05/04/18 0036 .  0.9 %  sodium chloride infusion, 250 mL, Intravenous, Once, Rickard Patience, MD .  acetaminophen (TYLENOL) tablet 650 mg, 650 mg, Oral, Q6H PRN **OR** acetaminophen (TYLENOL) suppository 650 mg, 650 mg, Rectal, Q6H PRN, Shaune Pollack, MD .  albuterol (PROVENTIL) (2.5 MG/3ML) 0.083% nebulizer solution 2.5 mg, 2.5 mg, Nebulization, Q2H PRN, Shaune Pollack, MD .  ALPRAZolam Prudy Feeler) tablet 0.25 mg, 0.25 mg, Oral, TID PRN, Alford Highland, MD, 0.25 mg at 05/04/18 1151 .  bisacodyl (DULCOLAX) EC tablet 5 mg, 5 mg, Oral, Daily PRN, Shaune Pollack, MD .  buPROPion (WELLBUTRIN XL) 24 hr tablet 300 mg, 300 mg, Oral, Daily, Shaune Pollack, MD, 300 mg at 05/04/18 0905 .  diphenhydrAMINE (BENADRYL) capsule 25 mg, 25 mg, Oral, Once, Rickard Patience, MD .  DULoxetine (CYMBALTA) DR capsule 60 mg, 60 mg, Oral, Daily, Shaune Pollack, MD, 60 mg at 05/04/18 0905 .  gabapentin (NEURONTIN) tablet 600 mg, 600 mg, Oral, BID, Shaune Pollack, MD, 600 mg at 05/04/18 0905 .  Immune Globulin 10% (PRIVIGEN) IV infusion 90 g, 90 g, Intravenous, Q24 Hr x 2, Rickard Patience, MD, Stopped at 05/04/18 0146 .  methylPREDNISolone sodium succinate (SOLU-MEDROL) 125 mg/2 mL injection 94.375 mg, 1 mg/kg, Intravenous, Daily, Rickard Patience, MD .  ondansetron [facility]) tablet 4 mg, 4 mg, Oral, Q6H PRN **OR** ondansetron (ZOFRAN) injection 4 mg, 4 mg, Intravenous, Q6H PRN, Shaune Pollack, MD .  pantoprazole (PROTONIX) EC tablet 40 mg, 40 mg, Oral, Daily, Shaune Pollack, MD, 40 mg at 05/04/18 0905 .  rOPINIRole (REQUIP) tablet 4 mg, 4 mg, Oral, QHS, Shaune Pollack, MD, 4 mg at 05/03/18 2116 .  senna-docusate (Senokot-S) tablet 1 tablet, 1 tablet, Oral, QHS PRN, Shaune Pollack, MD .  sodium chloride flush (NS) 0.9 % injection 10 mL, 10 mL, Intracatheter, PRN, Rickard Patience, MD .  sodium chloride flush (NS) 0.9 % injection 3 mL, 3 mL, Intravenous, Q12H, Shaune Pollack, MD, 3 mL at 05/04/18 0905 .  sodium chloride flush (NS) 0.9 % injection 3 mL, 3 mL, Intravenous, PRN, Shaune Pollack, MD .  sodium  chloride flush (NS) 0.9 % injection 3 mL, 3 mL, Intracatheter, PRN, Rickard Patience, MD .  topiramate (TOPAMAX) tablet 50 mg, 50 mg, Oral, BID, Shaune Pollack, MD, 50 mg at 05/04/18 0905 .  traMADol (ULTRAM) tablet 50 mg, 50 mg, Oral, Q6H PRN, Sudini, Srikar, MD .  traZODone (DESYREL) tablet 50 mg, 50 mg, Oral, QHS, Shaune Pollack, MD, 50 mg at 05/03/18 2116   Physical exam:  Vitals:   05/04/18 0146 05/04/18 0215 05/04/18 0528 05/04/18 0909  BP: (!) 148/84 (!) 156/72 136/74 (!) 146/69  Pulse: 94 93 89 87  Resp: 18 18 17    Temp: 98 F (36.7 C) 98.5 F (36.9 C) 98.3 F (36.8 C) 98.4 F (36.9 C)  TempSrc: Oral Oral Oral Oral  SpO2: 99% 99% 93% 94%  Weight:      Height:       Physical Exam  Constitutional: She is oriented to person, place, and time and well-developed, well-nourished, and in no distress. No distress.  HENT:  Head: Normocephalic and atraumatic.  Eyes: Pupils are equal, round, and reactive to light. EOM are normal.  Neck: Normal range of motion. Neck supple.  Cardiovascular: Normal rate and normal heart sounds.  Pulmonary/Chest: Effort normal and breath sounds normal.  Abdominal: Soft. Bowel sounds are normal. She exhibits no distension.  Musculoskeletal: Normal range of motion. She exhibits no edema.  Neurological: She is alert and oriented to person, place, and time.  Skin: Skin is warm and dry.  Scattered petechia and bruising on bilateral lower extremities. Right thigh bruise/ecchymosis Right elbow bruise Right index finger petechia          CMP Latest Ref Rng & Units 05/03/2018  Glucose 65 - 99 mg/dL 016(W)  BUN 6 - 20 mg/dL 17  Creatinine 1.09 - 3.23 mg/dL 5.57  Sodium 322 - 025 mmol/L 141  Potassium 3.5 - 5.1 mmol/L 3.8  Chloride 101 - 111 mmol/L 113(H)  CO2 22 - 32 mmol/L 22  Calcium 8.9 - 10.3 mg/dL 4.2(H)  Total Protein 6.5 - 8.1 g/dL 6.3(L)  Total Bilirubin 0.3 - 1.2 mg/dL 0.7  Alkaline Phos 38 - 126 U/L 71  AST 15 - 41 U/L 28  ALT 14 - 54 U/L 20   CBC  Latest Ref Rng & Units 05/04/2018  WBC 3.6 - 11.0 K/uL 11.0  Hemoglobin 12.0 - 16.0 g/dL 11.2(L)  Hematocrit 35.0 - 47.0 % 33.7(L)  Platelets 150 - 440 K/uL 5(LL)   RADIOGRAPHIC STUDIES: I have personally reviewed the radiological images as listed and agreed with the findings in the report. Dg Chest 2 View  Result Date: 05/03/2018 CLINICAL DATA:  Hemoptysis. No chest pain or shortness of breath. History of rheumatoid arthritis and pulmonary fibrosis. EXAM: CHEST - 2 VIEW COMPARISON:  None. FINDINGS: Pulse generator device in the right chest wall. Mild cardiomegaly. Normal pulmonary vascularity. Mild interstitial thickening at the lung bases. No focal consolidation, pleural effusion, or pneumothorax. No acute osseous abnormality. IMPRESSION: 1. Mild interstitial thickening at the lung bases likely reflects interstitial lung disease given clinical history. No definite active cardiopulmonary disease. Electronically Signed   By: Obie Dredge M.D.   On: 05/03/2018 12:09   Ct Angio Chest Pe W And/or Wo Contrast  Result Date: 05/03/2018 CLINICAL DATA:  Hemoptysis. Known pulmonary fibrosis and rheumatoid arthritis. Chronic cough. EXAM: CT ANGIOGRAPHY CHEST WITH CONTRAST TECHNIQUE: Multidetector CT imaging of the chest was performed using the standard protocol during bolus administration of intravenous contrast. Multiplanar CT image reconstructions and MIPs were obtained to evaluate the vascular anatomy. CONTRAST:  102mL OMNIPAQUE IOHEXOL 350 MG/ML SOLN COMPARISON:  None. FINDINGS: Cardiovascular: Mild cardiomegaly. Mild calcified plaque over the left anterior descending coronary artery. Minimal calcified plaque over the thoracic aorta. Pulmonary arterial system is normal without evidence of emboli. Remaining vascular structures are unremarkable. Mediastinum/Nodes: No evidence of mediastinal or hilar adenopathy. Possible tiny hiatal hernia. Remaining mediastinal structures are within normal. Lungs/Pleura:  Lungs are adequately inflated demonstrate a patchy bilateral hazy nodular airspace process likely due to infection. Inflammatory or neoplastic process is possible. Mild peripheral interstitial changes over the lower lobes. No effusion. Calcification versus surgical sutures along the left hemidiaphragm. Airways are within normal. Upper Abdomen: Minimal calcified plaque over  the abdominal aorta. Musculoskeletal: Mild degenerate change of the spine. Review of the MIP images confirms the above findings. IMPRESSION: No evidence of pulmonary embolism. Patchy bilateral hazy nodular airspace process likely due to infection. Inflammatory or neoplastic disease is less likely. Recommend follow-up to resolution. Mild interstitial changes within the lung bases. Mild cardiomegaly. Minimal atherosclerotic coronary artery disease. Aortic Atherosclerosis (ICD10-I70.0). Electronically Signed   By: Elberta Fortis M.D.   On: 05/03/2018 13:45    Assessment and plan-  Patient is a 72 y.o. female with history of fibrosis rheumatoid arthritis sulfasalazine on  presented with petechiae, easy bruising, hemoptysis and found to have severe thrombocytopenia.  #Thrombocytopenia, status post platelet transfusion, 1 dose of IVIG, Steroids. No dramatic response with her platelet counts.  Normal WBC count and hemoglobin.  No schistocytes immature cells on peripheral smear per hematology technician.  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 Continue second dose of IVIG 1 g/kg today.  Continue Solu-Medrol 1 mg/kg daily Since her platelet count is still less than 10,000, high risk of spontaneous bleeding, worsening bruising, recommend transfuse 1 unit of packed to keep platelets above 10,000.  Check post transfusion platelet counts. Continue GI prophylaxis with proton pump inhibitor. On physical examination, I do not feel splenomegaly.  CT PE protocol with the visualized upper abdomen, spleen size appears normal however not fully visualized.  Will obtain  ultrasound of spleen to rule out borderline splenomegaly.  Patient appears very anxious since she is visiting here and now she is in the [place] and has to miss her flightl back home. I had a lengthy discussion and explained current situation, risk of bleeding, and potential treatment options if covered treatments not working.  Also explained to patient that ITP is a diagnosis of exclusion.  I will check ultrasound spleen, check hepatitis panel HIV, folate and B12 level.  Patient appreciated the explanation.  #Hemoptysis Image were independently reviewed by me no evidence of pulmonary embolism.  Discussed with patient. Small amount of hemoptysis likely secondary to thrombocytopenia.  #Anemia, hemoglobin slightly decreased compared to yesterday's reading.  Likely due to diffuse bruising submucosal bleeding.  Continue monitor CBC daily.  Thank you for allowing me to participate in the care of this patient.  HemOn will continue follow-up patient's inpatient course. Total face to face encounter time for this patient visit was . >50% of the time was  spent in counseling and coordination of care.  I am off next week and Dr. Merlene Pulling who is on call will cover me.   Rickard Patience, MD, PhD Hematology Oncology [facility] Pager- [phone] 05/04/2018

## 2018-05-05 ENCOUNTER — Inpatient Hospital Stay: Payer: Medicare HMO

## 2018-05-05 DIAGNOSIS — D649 Anemia, unspecified: Secondary | ICD-10-CM

## 2018-05-05 LAB — PREPARE PLATELET PHERESIS: Unit division: 0

## 2018-05-05 LAB — CBC
HCT: 31.5 % — ABNORMAL LOW (ref 35.0–47.0)
Hemoglobin: 10.7 g/dL — ABNORMAL LOW (ref 12.0–16.0)
MCH: 30.3 pg (ref 26.0–34.0)
MCHC: 34 g/dL (ref 32.0–36.0)
MCV: 89.2 fL (ref 80.0–100.0)
RBC: 3.53 MIL/uL — ABNORMAL LOW (ref 3.80–5.20)
RDW: 14.2 % (ref 11.5–14.5)
WBC: 10.4 10*3/uL (ref 3.6–11.0)

## 2018-05-05 LAB — BPAM PLATELET PHERESIS
Blood Product Expiration Date: 201906242359
ISSUE DATE / TIME: 201906231610
UNIT TYPE AND RH: 5100

## 2018-05-05 LAB — PATHOLOGIST SMEAR REVIEW

## 2018-05-05 MED ORDER — CEFTRIAXONE SODIUM 1 G IJ SOLR
1.0000 g | INTRAMUSCULAR | Status: DC
Start: 2018-05-05 — End: 2018-05-07
  Administered 2018-05-05 – 2018-05-06 (×2): 1 g via INTRAVENOUS
  Filled 2018-05-05: qty 1
  Filled 2018-05-05 (×2): qty 10

## 2018-05-05 MED ORDER — ALBUTEROL SULFATE (2.5 MG/3ML) 0.083% IN NEBU
2.5000 mg | INHALATION_SOLUTION | Freq: Four times a day (QID) | RESPIRATORY_TRACT | Status: DC
  Administered 2018-05-05 – 2018-05-06 (×5): 2.5 mg via RESPIRATORY_TRACT
  Filled 2018-05-05 (×5): qty 3

## 2018-05-05 MED ORDER — AZITHROMYCIN 500 MG PO TABS
500.0000 mg | ORAL_TABLET | Freq: Every day | ORAL | Status: AC
  Administered 2018-05-05: 500 mg via ORAL
  Filled 2018-05-05: qty 1

## 2018-05-05 MED ORDER — AZITHROMYCIN 500 MG PO TABS
250.0000 mg | ORAL_TABLET | Freq: Every day | ORAL | Status: DC
  Administered 2018-05-06: 250 mg via ORAL
  Filled 2018-05-05: qty 1

## 2018-05-05 MED ORDER — VITAMIN B-12 1000 MCG PO TABS
1000.0000 ug | ORAL_TABLET | Freq: Every day | ORAL | Status: DC
  Administered 2018-05-06: 1000 ug via ORAL
  Filled 2018-05-05: qty 1

## 2018-05-05 MED ORDER — GUAIFENESIN 100 MG/5ML PO SOLN
5.0000 mL | ORAL | Status: DC | PRN
  Administered 2018-05-05 – 2018-05-06 (×2): 100 mg via ORAL
  Filled 2018-05-05 (×3): qty 5

## 2018-05-05 MED ORDER — ATORVASTATIN CALCIUM 20 MG PO TABS
80.0000 mg | ORAL_TABLET | Freq: Every day | ORAL | Status: DC
  Administered 2018-05-05 – 2018-05-06 (×2): 80 mg via ORAL
  Filled 2018-05-05 (×2): qty 4

## 2018-05-05 NOTE — Progress Notes (Signed)
[facility] Hematology/Oncology Progress Note  Date of admission: 05/03/2018  Hospital day:  05/05/2018  Chief Complaint: Natasha Shaffer is a 72 y.o. female who was admitted on 05/03/2018 with thrombocytopenia.  Subjective:  She denies any bleeding.  Social History: The patient is alone today.  Allergies:  Allergies  Allergen Reactions  . Codeine Other (See Comments)    "feels bad"  . Etomidate Other (See Comments)    "muscle twitches"    Scheduled Medications: . albuterol  2.5 mg Nebulization Q6H  . atorvastatin  80 mg Oral q1800  . [START ON 05/06/2018] azithromycin  250 mg Oral Daily  . buPROPion  300 mg Oral Daily  . DULoxetine  60 mg Oral Daily  . gabapentin  600 mg Oral BID  . methylPREDNISolone (SOLU-MEDROL) injection  1 mg/kg Intravenous Daily  . pantoprazole  40 mg Oral Daily  . rOPINIRole  4 mg Oral QHS  . sodium chloride flush  3 mL Intravenous Q12H  . topiramate  50 mg Oral BID  . traZODone  50 mg Oral QHS    Review of Systems: GENERAL:  Feels tired.  No fevers, sweats or weight loss. PERFORMANCE STATUS (ECOG):  1 HEENT:  No visual changes, runny nose, sore throat, mouth sores or tenderness. Lungs: Interstitial lung disease.  Pulmonary fibrosis.  Little shortness of breath.  Cough.  No hemoptysis. Cardiac:  No chest pain, palpitations, orthopnea, or PND. GI:  No nausea, vomiting, diarrhea, constipation, melena or hematochezia. GU:  No urgency, frequency, dysuria, or hematuria. Musculoskeletal:  No back pain.  Arthritis.  No muscle tenderness. Extremities:  No pain or swelling. Skin:  Petechiae for months.  Bruising.  No skin changes. Neuro:  No headache, numbness or weakness, balance or coordination issues. Endocrine:  No diabetes, thyroid issues, hot flashes or night sweats. Psych:  No mood changes, depression or anxiety. Pain:  No focal pain. Review of systems:  All other systems reviewed and found to be negative.  Physical  Exam: Blood pressure (!) 153/76, pulse 98, temperature 98 F (36.7 C), temperature source Oral, resp. rate 17, height 5\' 5"  (1.651 m), weight 208 lb (94.3 kg), SpO2 94 %.  GENERAL:  Well developed, well nourished, woman sitting comfortably on the medical oncology unit in no acute distress. MENTAL STATUS:  Alert and oriented to person, place and time. HEAD:  Red hair.  Normocephalic, atraumatic, face symmetric, no Cushingoid features. EYES:  Brown eyes.  Pupils equal round and reactive to light and accomodation.  No conjunctivitis or scleral icterus. ENT:  Palatal petechiae.  Tongue petechiae.  Mucous membranes moist.  RESPIRATORY:  Fine dry crackles at the bases on deep inspiration.  No wheezes or rhonchi. CARDIOVASCULAR:  Regular rate and rhythm without murmur, rub or gallop. ABDOMEN:  Soft, non-tender, with active bowel sounds, and no hepatosplenomegaly.  No masses. SKIN:  Lower extremity petechiae.  Large right hip bruise.  Bruising on arms. EXTREMITIES: No edema, no skin discoloration or tenderness.  No palpable cords. LYMPH NODES: No palpable cervical, supraclavicular, axillary or inguinal adenopathy  NEUROLOGICAL: Unremarkable. PSYCH:  Appropriate.   Results for orders placed or performed during the hospital encounter of 05/03/18 (from the past 48 hour(s))  CBC     Status: Abnormal   Collection Time: 05/04/18  4:49 AM  Result Value Ref Range   WBC 11.0 3.6 - 11.0 K/uL   RBC 3.82 3.80 - 5.20 MIL/uL   Hemoglobin 11.2 (L) 12.0 - 16.0 g/dL   HCT 05/06/18 (  L) 35.0 - 47.0 %   MCV 88.2 80.0 - 100.0 fL   MCH 29.4 26.0 - 34.0 pg   MCHC 33.3 32.0 - 36.0 g/dL   RDW 16.6 06.3 - 01.6 %   Platelets 5 (LL) 150 - 440 K/uL    Comment: RESULT REPEATED AND VERIFIED CRITICAL VALUE NOTED.  VALUE IS CONSISTENT WITH PREVIOUSLY REPORTED AND CALLED VALUE. Performed at [facility], [address] [place], [place] [address]   Lactate dehydrogenase     Status: Abnormal   Collection Time:  05/04/18  4:49 AM  Result Value Ref Range   LDH 230 (H) 98 - 192 U/L    Comment: Performed at [facility], [address] [place], [place] [address]  Prepare Pheresed Platelets     Status: None   Collection Time: 05/04/18  1:32 PM  Result Value Ref Range   Unit Number [id number]    Blood Component Type PLTPHER LR1    Unit division 00    Status of Unit ISSUED,FINAL    Transfusion Status      OK TO TRANSFUSE Performed at [facility], [address] [place], [place] [address]   Platelet count     Status: Abnormal   Collection Time: 05/04/18  2:44 PM  Result Value Ref Range   Platelets <5 (LL) 150 - 440 K/uL    Comment: RESULT REPEATED AND VERIFIED CRITICAL VALUE NOTED.  VALUE IS CONSISTENT WITH PREVIOUSLY REPORTED AND CALLED VALUE. Performed at [facility], [address] [place], [place] [address]   Folate     Status: None   Collection Time: 05/04/18  2:44 PM  Result Value Ref Range   Folate 9.0 >5.9 ng/mL    Comment: Performed at [facility], [address] [place], [place] [address]  Vitamin B12     Status: None   Collection Time: 05/04/18  2:44 PM  Result Value Ref Range   Vitamin B-12 277 180 - 914 pg/mL    Comment: (NOTE) This assay is not validated for testing neonatal or myeloproliferative syndrome specimens for Vitamin B12 levels. Performed at [facility], [address] [place], [place] [address]   CBC     Status: Abnormal   Collection Time: 05/05/18  3:48 AM  Result Value Ref Range   WBC 10.4 3.6 - 11.0 K/uL   RBC 3.53 (L) 3.80 - 5.20 MIL/uL   Hemoglobin 10.7 (L) 12.0 - 16.0 g/dL   HCT 69.4 (L) 85.4 - 62.7 %   MCV 89.2 80.0 - 100.0 fL   MCH 30.3 26.0 - 34.0 pg   MCHC 34.0 32.0 - 36.0 g/dL   RDW 03.5 00.9 - 38.1 %   Platelets <5 (LL) 150 - 440 K/uL    Comment: RESULT REPEATED AND VERIFIED PLATELET COUNT CONFIRMED BY SMEAR CRITICAL VALUE NOTED.  VALUE IS CONSISTENT WITH PREVIOUSLY REPORTED AND CALLED  VALUE. Performed at [facility], [address], [place], [place] [address]   Pathologist smear review     Status: None   Collection Time: 05/05/18  3:48 AM  Result Value Ref Range   Path Review      Peripheral smear review is notable for normocytic anemia and severe thrombocytopenia. Negative for schistocytes. Dr. Excell Seltzer.    Comment: Performed at [facility], [address] [place], [place] [address]   Dg Chest 2 View  Result Date: 05/05/2018 CLINICAL DATA:  Cough and chest pain. EXAM: CHEST - 2 VIEW  COMPARISON:  05/03/2018 chest CT and radiographs FINDINGS: Mild cardiomegaly again noted. Mild interstitial and probable airspace opacities may be slightly increased from recent chest radiograph. No pneumothorax or definite pleural effusion. No acute bony abnormality identified. RIGHT sided pulse degenerating device again noted. IMPRESSION: Question slightly increased mild interstitial and probable airspace opacities bilaterally. No other significant change. Electronically Signed   By: Harmon Pier M.D.   On: 05/05/2018 09:21   US Spleen (abdomen Limited)  Result Date: 05/04/2018 CLINICAL DATA:  Thrombocytopenia. EXAM: ULTRASOUND ABDOMEN LIMITED COMPARISON:  None. FINDINGS: Spleen measures 8.6 x 7.3 x 6.1 cm. This is within normal limits. No focal abnormality is noted. IMPRESSION: Normal size and appearance of spleen. Electronically Signed   By: Lupita Raider, M.D.   On: 05/04/2018 19:22    Assessment:  Natasha Shaffer is a 72 y.o. 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  She presented with a lower extremity petechiae over several month(s) then oral bleeding.  She denies any new medications or herbal products.  Sulfasalazine discontinued (incidence thrombocytopenia 1%).  She denies any recent infections.  Normal labs include:  creatinine, folate, PT, PTT.  Hepatitis panel and HIV testing are pending.  LDH slightly elevated.  B12 was low normal  (277).  Peripheral smear reveals a normocytic anemia with severe thrombocytopenia.  There were no schistocytes.  Abdominal ultrasound revealed no splenomegaly.  Symptomatically, she notes bruising without bleeding.  Exam reveals no adenopathy or hepatosplenomegaly.  Plan: 1.  Hematology:  Presumed ITP.  Patient is day 2 of steroids (1 mg/kg solumedrol).  She received IVIG 1 gm/kg on 06/22 and 06/23.  Patient has received 3 units of pheresed platelets without response.  Typically expect to see improvement in platelet count on day 3 after IVIG.  Check TSH, free T4, ANA.  Follow-up pending HIV testing and hepatitis serologies.  Begin oral B12.  Check CBC daily.  If platelets given, check 1 hour post platelet count.  Likely will not see a significant increase in platelet count.  If life threatening bleeding: Solumedrol 1 gm IV then daily x2, platelet transfusion, and consideration of splenectomy.   Rosey Bath, MD  05/05/2018, 8:48 PM

## 2018-05-05 NOTE — Plan of Care (Signed)
  Problem: Education: Goal: Knowledge of General Education information will improve Outcome: Progressing   Problem: Health Behavior/Discharge Planning: Goal: Ability to manage health-related needs will improve Outcome: Progressing   Problem: Clinical Measurements: Goal: Will remain free from infection Outcome: Progressing Note:  Started po and iv abx Goal: Diagnostic test results will improve Outcome: Progressing Goal: Respiratory complications will improve Outcome: Progressing Goal: Cardiovascular complication will be avoided Outcome: Progressing   Problem: Activity: Goal: Risk for activity intolerance will decrease Outcome: Progressing   Problem: Nutrition: Goal: Adequate nutrition will be maintained Outcome: Progressing   Problem: Coping: Goal: Level of anxiety will decrease Outcome: Progressing   Problem: Elimination: Goal: Will not experience complications related to bowel motility Outcome: Progressing Goal: Will not experience complications related to urinary retention Outcome: Progressing   Problem: Pain Managment: Goal: General experience of comfort will improve Outcome: Progressing   Problem: Safety: Goal: Ability to remain free from injury will improve Outcome: Progressing   Problem: Skin Integrity: Goal: Risk for impaired skin integrity will decrease Outcome: Progressing

## 2018-05-05 NOTE — Progress Notes (Signed)
Chaplain received a call from the unit secretary to visit the patient upon patient's request. Upon entering the room, chaplain noted that she was very welcoming and seemed to be glad to see the chaplain. Patient's presenting concern was that she was feeling pretty sad. She is in town from [place], [place] visiting a dear friend who is celebrating her 50th wedding anniversary when she fell ill, thus necessitating a trip to the ED. Patient is recently widowed (1 year anniversary in August) and was very tearful during conversation about her estranged relationship with her only son and his family (including young grandchildren). Patient stated that she has good friends, but she desires to be reconciled back to her son and his family.  There is deep hurt there from the rift. 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 Friendships can't replace the longing she has for family, particularly during illness. Patient states fears about what will happen to her and who she can call if something happens to her health. Chaplain provided grief, emotional support, and prayer. Chaplain talked with patient about guarding her heart and finding space to forgive herself. After prayer, patient stated that she was tired and would really love for chaplain to check back in with her later.    05/05/18 1300  Clinical Encounter Type  Visited With Patient  Visit Type Spiritual support  Referral From Patient  Spiritual Encounters  Spiritual Needs Prayer;Emotional;Grief support  Stress Factors  Patient Stress Factors Family relationships

## 2018-05-05 NOTE — Care Management (Signed)
Patient visiting from [place]. Prior to this episode of illness, independent in all adls. Admitted with severe thrombocytopenia.  No issues with accessing medical care in her home state or paying for meds .  Independent in adls.  No supplemental oxygen needs - even though has diagnosis of pulmonary fibrosis. Oncology is is consulting

## 2018-05-05 NOTE — Progress Notes (Signed)
Patient ID: Natasha Shaffer, female   DOB: [birth year], 72 y.o.   MRN: [id number]  [facility] PROGRESS NOTE  Natasha Shaffer INO:[id number] DOB: November 14, 1945 DOA: 05/03/2018 PCP: System, Provider Not In  HPI/Subjective: Patient this morning coughing a little bit more.  Some bloody phlegm.  Some shortness of breath and some chest tightness.  Objective: Vitals:   05/05/18 0836 05/05/18 0839  BP: (!) 148/81   Pulse: 89 90  Resp: 20   Temp: 98.1 F (36.7 C)   SpO2: 90% 90%    Filed Weights   05/03/18 1039 05/03/18 1438  Weight: 92.1 kg (203 lb) 94.3 kg (208 lb)    ROS: Review of Systems  Constitutional: Negative for chills and fever.  Eyes: Negative for blurred vision.  Respiratory: Positive for cough, hemoptysis and shortness of breath.   Cardiovascular: Positive for chest pain.  Gastrointestinal: Negative for abdominal pain, constipation, diarrhea, nausea and vomiting.  Genitourinary: Negative for dysuria.  Musculoskeletal: Negative for joint pain.  Neurological: Negative for dizziness and headaches.   Exam: Physical Exam  Constitutional: She is oriented to person, place, and time.  HENT:  Nose: No mucosal edema.  Mouth/Throat: No oropharyngeal exudate or posterior oropharyngeal edema.  Eyes: Pupils are equal, round, and reactive to light. Conjunctivae, EOM and lids are normal.  Neck: No JVD present. Carotid bruit is not present. No edema present. No thyroid mass and no thyromegaly present.  Cardiovascular: S1 normal and S2 normal. Exam reveals no gallop.  No murmur heard. Pulses:      Dorsalis pedis pulses are 2+ on the right side, and 2+ on the left side.  Respiratory: No respiratory distress. She has decreased breath sounds in the right lower field and the left lower field. She has no wheezes. She has rhonchi in the right lower field and the left lower field. She has no rales.  GI: Soft. Bowel sounds are normal. There is no tenderness.  Musculoskeletal:       Right  shoulder: She exhibits no swelling.  Lymphadenopathy:    She has no cervical adenopathy.  Neurological: She is alert and oriented to person, place, and time. No cranial nerve deficit.  Skin: Skin is warm. Nails show no clubbing.  Petechiae lower extremities.  Bruising right hip and right elbow.  Some small bruises on the legs and arms.  Psychiatric: She has a normal mood and affect.      Data Reviewed: Basic Metabolic Panel: Recent Labs  Lab 05/03/18 1050  NA 141  K 3.8  CL 113*  CO2 22  GLUCOSE 115*  BUN 17  CREATININE 0.83  CALCIUM 8.5*   Liver Function Tests: Recent Labs  Lab 05/03/18 1050  AST 28  ALT 20  ALKPHOS 71  BILITOT 0.7  PROT 6.3*  ALBUMIN 4.0   CBC: Recent Labs  Lab 05/03/18 1209 05/04/18 0449 05/04/18 1444 05/05/18 0348  WBC 10.1 11.0  --  10.4  NEUTROABS 7.7*  --   --   --   HGB 12.5 11.2*  --  10.7*  HCT 37.4 33.7*  --  31.5*  MCV 88.5 88.2  --  89.2  PLT <5* 5* <5* <5*     Studies: Dg Chest 2 View  Result Date: 05/05/2018 CLINICAL DATA:  Cough and chest pain. EXAM: CHEST - 2 VIEW COMPARISON:  05/03/2018 chest CT and radiographs FINDINGS: Mild cardiomegaly again noted. Mild interstitial and probable airspace opacities may be slightly increased from recent chest radiograph. No pneumothorax or definite pleural  effusion. No acute bony abnormality identified. RIGHT sided pulse degenerating device again noted. IMPRESSION: Question slightly increased mild interstitial and probable airspace opacities bilaterally. No other significant change. Electronically Signed   By: Harmon Pier M.D.   On: 05/05/2018 09:21   US Spleen (abdomen Limited)  Result Date: 05/04/2018 CLINICAL DATA:  Thrombocytopenia. EXAM: ULTRASOUND ABDOMEN LIMITED COMPARISON:  None. FINDINGS: Spleen measures 8.6 x 7.3 x 6.1 cm. This is within normal limits. No focal abnormality is noted. IMPRESSION: Normal size and appearance of spleen. Electronically Signed   By: Lupita Raider,  M.D.   On: 05/04/2018 19:22    Scheduled Meds: . albuterol  2.5 mg Nebulization Q6H  . atorvastatin  80 mg Oral q1800  . [START ON 05/06/2018] azithromycin  250 mg Oral Daily  . buPROPion  300 mg Oral Daily  . DULoxetine  60 mg Oral Daily  . gabapentin  600 mg Oral BID  . methylPREDNISolone (SOLU-MEDROL) injection  1 mg/kg Intravenous Daily  . pantoprazole  40 mg Oral Daily  . rOPINIRole  4 mg Oral QHS  . sodium chloride flush  3 mL Intravenous Q12H  . topiramate  50 mg Oral BID  . traZODone  50 mg Oral QHS   Continuous Infusions: . sodium chloride Stopped (05/04/18 0036)  . cefTRIAXone (ROCEPHIN)  IV Stopped (05/05/18 1010)    Assessment/Plan:  1. Acute ITP with severe thrombocytopenia.  Patient ordered daily dosing of Solu-Medrol and also ordered immunoglobulin.  Hematology following.  Check platelet count on a daily basis.  As per hematology no schistocytes seen on peripheral smear.  Platelet count still less than 5 2. Shortness of breath and chest pain.  Looking back at CT scan of the chest they could not rule out pneumonia at that time.  Start Rocephin and Zithromax.  Start nebulizer treatments. 3. History of rheumatoid arthritis and pulmonary fibrosis and interstitial lung disease. 4. GERD on Protonix 5. Restless leg syndrome on Requip 6. Anxiety depression continue psychiatric medications  Code Status:     Code Status Orders  (From admission, onward)        Start     Ordered   05/03/18 1432  Full code  Continuous     05/03/18 1431    Code Status History    This patient has a current code status but no historical code status.    Advance Directive Documentation     Most Recent Value  Type of Advance Directive  Healthcare Power of Attorney, Living will  Pre-existing out of facility DNR order (yellow form or pink MOST form)  -  "MOST" Form in Place?  -     Disposition Plan: Platelet count will have to be higher upon  disposition.  Consultants:  Hematology  Time spent: 27 minutes  Olivier Frayre [employer]

## 2018-05-06 ENCOUNTER — Inpatient Hospital Stay: Payer: Medicare HMO

## 2018-05-06 DIAGNOSIS — Z8701 Personal history of pneumonia (recurrent): Secondary | ICD-10-CM

## 2018-05-06 DIAGNOSIS — R05 Cough: Secondary | ICD-10-CM

## 2018-05-06 DIAGNOSIS — R918 Other nonspecific abnormal finding of lung field: Secondary | ICD-10-CM

## 2018-05-06 LAB — CBC WITH DIFFERENTIAL/PLATELET
Basophils Absolute: 0.1 10*3/uL (ref 0–0.1)
Basophils Relative: 0 %
Eosinophils Absolute: 0 10*3/uL (ref 0–0.7)
Eosinophils Relative: 0 %
HCT: 31.9 % — ABNORMAL LOW (ref 35.0–47.0)
Hemoglobin: 10.5 g/dL — ABNORMAL LOW (ref 12.0–16.0)
Lymphocytes Relative: 17 %
Lymphs Abs: 2.4 10*3/uL (ref 1.0–3.6)
MCH: 29.4 pg (ref 26.0–34.0)
MCHC: 32.9 g/dL (ref 32.0–36.0)
MCV: 89.5 fL (ref 80.0–100.0)
Monocytes Absolute: 0.7 10*3/uL (ref 0.2–0.9)
Monocytes Relative: 5 %
Neutro Abs: 11 10*3/uL — ABNORMAL HIGH (ref 1.4–6.5)
Neutrophils Relative %: 78 %
Platelets: <5 10*3/uL — CL (ref 150–440)
RBC: 3.56 MIL/uL — ABNORMAL LOW (ref 3.80–5.20)
RDW: 14.3 % (ref 11.5–14.5)
WBC: 14.2 10*3/uL — ABNORMAL HIGH (ref 3.6–11.0)

## 2018-05-06 LAB — T4, FREE: Free T4: 0.7 ng/dL — ABNORMAL LOW (ref 0.82–1.77)

## 2018-05-06 LAB — HIV ANTIBODY (ROUTINE TESTING W REFLEX): HIV Screen 4th Generation wRfx: NONREACTIVE

## 2018-05-06 LAB — HEPATITIS PANEL, ACUTE
HCV AB: 0.2 {s_co_ratio} (ref 0.0–0.9)
HEP A IGM: NEGATIVE
HEP B C IGM: NEGATIVE
HEP B S AG: NEGATIVE

## 2018-05-06 LAB — TSH: TSH: 2.104 u[IU]/mL (ref 0.350–4.500)

## 2018-05-06 MED ORDER — BISACODYL 5 MG PO TBEC: 5.0000 mg | DELAYED_RELEASE_TABLET | Freq: Every day | ORAL | 0 refills | Status: AC | PRN

## 2018-05-06 MED ORDER — SODIUM CHLORIDE 0.9 % IV SOLN: 1.0000 g | INTRAVENOUS | Status: AC

## 2018-05-06 MED ORDER — METHYLPREDNISOLONE SODIUM SUCC 125 MG IJ SOLR: 80.0000 mg | Freq: Two times a day (BID) | INTRAMUSCULAR | Status: DC

## 2018-05-06 MED ORDER — METHYLPREDNISOLONE SODIUM SUCC 125 MG IJ SOLR
125.0000 mg | Freq: Once | INTRAMUSCULAR | Status: AC
  Administered 2018-05-06: 20:00:00 125 mg via INTRAVENOUS
  Filled 2018-05-06: qty 2

## 2018-05-06 MED ORDER — TRAMADOL HCL 50 MG PO TABS: 50.0000 mg | ORAL_TABLET | Freq: Four times a day (QID) | ORAL | Status: AC | PRN

## 2018-05-06 MED ORDER — SODIUM CHLORIDE 0.9% IV SOLUTION
Freq: Once | INTRAVENOUS | Status: AC
  Administered 2018-05-06: 23:00:00 via INTRAVENOUS

## 2018-05-06 MED ORDER — ALPRAZOLAM 0.25 MG PO TABS: 0.2500 mg | ORAL_TABLET | Freq: Three times a day (TID) | ORAL | 0 refills | Status: AC | PRN

## 2018-05-06 MED ORDER — AZITHROMYCIN 250 MG PO TABS: 250.0000 mg | ORAL_TABLET | Freq: Every day | ORAL | 0 refills | Status: AC

## 2018-05-06 MED ORDER — ACETAMINOPHEN 325 MG PO TABS
650.0000 mg | ORAL_TABLET | Freq: Once | ORAL | Status: AC
  Administered 2018-05-06: 650 mg via ORAL
  Filled 2018-05-06: qty 2

## 2018-05-06 MED ORDER — METHYLPREDNISOLONE SODIUM SUCC 125 MG IJ SOLR: 1.0000 mg/kg | Freq: Every day | INTRAMUSCULAR | 0 refills | Status: DC

## 2018-05-06 MED ORDER — ALBUTEROL SULFATE (2.5 MG/3ML) 0.083% IN NEBU: 2.5000 mg | INHALATION_SOLUTION | Freq: Four times a day (QID) | RESPIRATORY_TRACT | Status: DC | PRN

## 2018-05-06 NOTE — Progress Notes (Signed)
[facility] Hematology/Oncology Progress Note  Date of admission: 05/03/2018  Hospital day:  05/06/2018  Chief Complaint: Natasha Shaffer is a 72 y.o. female who was admitted on 05/03/2018 with thrombocytopenia.  Subjective:  Cough.  Hemoptysis (small clot).  Short of breath without oxygen.  Social History: The patient is alone today.  She lives in [place].  Allergies:  Allergies  Allergen Reactions  . Codeine Other (See Comments)    "feels bad"  . Etomidate Other (See Comments)    "muscle twitches"    Scheduled Medications: . albuterol  2.5 mg Nebulization Q6H  . atorvastatin  80 mg Oral q1800  . azithromycin  250 mg Oral Daily  . buPROPion  300 mg Oral Daily  . DULoxetine  60 mg Oral Daily  . gabapentin  600 mg Oral BID  . methylPREDNISolone (SOLU-MEDROL) injection  1 mg/kg Intravenous Daily  . pantoprazole  40 mg Oral Daily  . rOPINIRole  4 mg Oral QHS  . sodium chloride flush  3 mL Intravenous Q12H  . topiramate  50 mg Oral BID  . traZODone  50 mg Oral QHS  . vitamin B-12  1,000 mcg Oral Daily    Review of Systems: GENERAL:  Feels tired.  No fevers, sweats or weight loss. PERFORMANCE STATUS (ECOG):  1 HEENT:  No visual changes, runny nose, sore throat, mouth sores or tenderness. Lungs: Interstitial lung disease.  Pulmonary fibrosis.  Rheumatoid in lung (old biopsy).  Little shortness of breath.  Cough. Hemoptysis. Cardiac:  No chest pain, palpitations, orthopnea, or PND. GI:  No nausea, vomiting, diarrhea, constipation, melena or hematochezia. GU:  No urgency, frequency, dysuria, or hematuria. Musculoskeletal:  No back pain.  Arthritis.  No muscle tenderness. Extremities:  No pain or swelling. Skin:  Petechiae for months.  Bruising.  No new areas of bruising.  No skin changes. Neuro:  No headache, numbness or weakness, balance or coordination issues. Endocrine:  No diabetes, thyroid issues, hot flashes or night sweats. Psych:  No mood changes,  depression or anxiety. Pain:  No focal pain. Review of systems:  All other systems reviewed and found to be negative.  Physical Exam: Blood pressure (!) 147/88, pulse 100, temperature 98.3 F (36.8 C), temperature source Oral, resp. rate 18, height 5\' 5"  (1.651 m), weight 208 lb (94.3 kg), SpO2 94 %.  GENERAL:  Well developed, well nourished, woman sitting comfortably on the medical oncology unit in no acute distress. MENTAL STATUS:  Alert and oriented to person, place and time. HEAD:  Red/auburn hair.  Normocephalic, atraumatic, face symmetric, no Cushingoid features. EYES:  Brown eyes.  Pupils equal round and reactive to light and accomodation.  No conjunctivitis or scleral icterus. ENT:  Palatal petechiae.  Tongue petechiae.  Mucous membranes moist.  RESPIRATORY:  Fine dry crackles at the bases on deep inspiration.  No wheezes or rhonchi. CARDIOVASCULAR:  Regular rate and rhythm without murmur, rub or gallop. ABDOMEN:  Soft, non-tender, with active bowel sounds, and no hepatosplenomegaly.  No masses. SKIN:  Lower extremity petechiae.  Large right hip bruise.  Bruises of various sizes. EXTREMITIES: No edema, no skin discoloration or tenderness.  No palpable cords. LYMPH NODES: No palpable cervical, supraclavicular, axillary or inguinal adenopathy  NEUROLOGICAL: Unremarkable. PSYCH:  Appropriate.   Results for orders placed or performed during the hospital encounter of 05/03/18 (from the past 48 hour(s))  CBC     Status: Abnormal   Collection Time: 05/05/18  3:48 AM  Result Value Ref Range  WBC 10.4 3.6 - 11.0 K/uL   RBC 3.53 (L) 3.80 - 5.20 MIL/uL   Hemoglobin 10.7 (L) 12.0 - 16.0 g/dL   HCT 25.0 (L) 53.9 - 76.7 %   MCV 89.2 80.0 - 100.0 fL   MCH 30.3 26.0 - 34.0 pg   MCHC 34.0 32.0 - 36.0 g/dL   RDW 34.1 93.7 - 90.2 %   Platelets <5 (LL) 150 - 440 K/uL    Comment: RESULT REPEATED AND VERIFIED PLATELET COUNT CONFIRMED BY SMEAR CRITICAL VALUE NOTED.  VALUE IS CONSISTENT WITH  PREVIOUSLY REPORTED AND CALLED VALUE. Performed at [facility], [address], [place], [place] [address]   Pathologist smear review     Status: None   Collection Time: 05/05/18  3:48 AM  Result Value Ref Range   Path Review      Peripheral smear review is notable for normocytic anemia and severe thrombocytopenia. Negative for schistocytes. Dr. Excell Seltzer.    Comment: Performed at [facility], [address] [place], [place] [address]  TSH     Status: None   Collection Time: 05/06/18  3:38 AM  Result Value Ref Range   TSH 2.104 0.350 - 4.500 uIU/mL    Comment: Performed by a 3rd Generation assay with a functional sensitivity of <=0.01 uIU/mL. Performed at [facility], [address] [place], [place] [address]   T4, free     Status: Abnormal   Collection Time: 05/06/18  3:38 AM  Result Value Ref Range   Free T4 0.70 (L) 0.82 - 1.77 ng/dL    Comment: (NOTE) Biotin ingestion may interfere with free T4 tests. If the results are inconsistent with the TSH level, previous test results, or the clinical presentation, then consider biotin interference. If needed, order repeat testing after stopping biotin. Performed at [facility], [address] [place], [place] [address]   CBC with Differential     Status: Abnormal   Collection Time: 05/06/18  3:38 AM  Result Value Ref Range   WBC 14.2 (H) 3.6 - 11.0 K/uL   RBC 3.56 (L) 3.80 - 5.20 MIL/uL   Hemoglobin 10.5 (L) 12.0 - 16.0 g/dL   HCT 22.2 (L) 97.9 - 89.2 %   MCV 89.5 80.0 - 100.0 fL   MCH 29.4 26.0 - 34.0 pg   MCHC 32.9 32.0 - 36.0 g/dL   RDW 11.9 41.7 - 40.8 %   Platelets <5 (LL) 150 - 440 K/uL    Comment: PLATELET COUNT CONFIRMED BY SMEAR CRITICAL RESULT CALLED TO, READ BACK BY AND VERIFIED WITH: C/ APRIL DEANGELIS @0541  05/06/18 [facility] RESULT REPEATED AND VERIFIED    Neutrophils Relative % 78 %   Neutro Abs 11.0 (H) 1.4 - 6.5 K/uL   Lymphocytes Relative 17 %   Lymphs Abs 2.4 1.0 - 3.6  K/uL   Monocytes Relative 5 %   Monocytes Absolute 0.7 0.2 - 0.9 K/uL   Eosinophils Relative 0 %   Eosinophils Absolute 0.0 0 - 0.7 K/uL   Basophils Relative 0 %   Basophils Absolute 0.1 0 - 0.1 K/uL    Comment: Performed at [facility], [address] [place], [place] [address]   Dg Chest 2 View  Result Date: 05/05/2018 CLINICAL DATA:  Cough and chest pain. EXAM: CHEST - 2 VIEW COMPARISON:  05/03/2018 chest CT and radiographs FINDINGS: Mild cardiomegaly again noted. Mild interstitial and probable airspace opacities may be slightly increased from recent chest radiograph. No pneumothorax or definite pleural  effusion. No acute bony abnormality identified. RIGHT sided pulse degenerating device again noted. IMPRESSION: Question slightly increased mild interstitial and probable airspace opacities bilaterally. No other significant change. Electronically Signed   By: Harmon Pier M.D.   On: 05/05/2018 09:21   Ct Head Wo Contrast  Result Date: 05/06/2018 CLINICAL DATA:  Posterior headaches for 3 days.  Low platelet level. EXAM: CT HEAD WITHOUT CONTRAST TECHNIQUE: Contiguous axial images were obtained from the base of the skull through the vertex without intravenous contrast. COMPARISON:  None. FINDINGS: Brain: The brainstem, cerebellum, cerebral peduncles, thalami, basal ganglia, basilar cisterns, and ventricular system appear within normal limits. No intracranial hemorrhage, mass lesion, or acute CVA. Vascular: Unremarkable Skull: Unremarkable Sinuses/Orbits: Unremarkable where included. Other: Degenerative spurring of both mandibular condyles. IMPRESSION: 1. No significant intracranial abnormalities. 2. Degenerative spurring of both mandibular condyles. Electronically Signed   By: Gaylyn Rong M.D.   On: 05/06/2018 07:37    Assessment:  Natasha Shaffer is a 72 y.o. 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  She presented with a lower extremity petechiae over several  month(s) then oral bleeding.  She denies any new medications or herbal products.  Sulfasalazine discontinued (incidence thrombocytopenia 1%).  She denies any recent infections.  Normal labs include:  creatinine, folate, TSH, PT, PTT, acute hepatitis panel and HIV testing.  Free T4 slightly low (0.70).  LDH slightly elevated.  B12 was low normal (277).  Patient started oral B12.  Peripheral smear reveals a normocytic anemia with severe thrombocytopenia.  There were no schistocytes.  Abdominal ultrasound revealed no splenomegaly.  Symptomatically, she notes shortness of breath without oxygen and hemoptysis (new).  Exam reveals no adenopathy or hepatosplenomegaly.  Plan: 1.  Hematology:  Presumed ITP.  Patient has known autoimmune disease.  Patient is day 3 of steroids (1 mg/kg solumedrol).  She received IVIG 1 gm/kg on 06/22 and 06/23 (total 2 gm/kg).  Patient has received 3 units of pheresed platelets without response.  Typically expect to see improvement in platelet count on day 3 after IVIG.  Platelet count remains < 5,000 with new hemoptysis.  Plan to discuss case with [facility] hematology.  May need transfer.  Awaiting call back from [facility].  Consider switch to Decadron 40 mg a day x 4.  Patient notes prior history of high dose steroids (? Solumedrol 500 mg IV) treating lung disease with subsequent extreme confusion.  May need to add Rituxan (acute hepatitis panel negative; hepatitis B core antibody total pending).  Possible N-plate. No evidence of lymphoma (chest CT without adenopathy and abdominal ultrasound without splenomegaly).  Consider abdomen/pelvic CT.  Concern for pulmonary bleeding and possible need for future splenectomy.  Check CBC daily.  If platelets given, check 1 hour post platelet count.  Likely will not see a significant increase in platelet count.  If life threatening bleeding: Solumedrol 1 gm IV then daily x2, platelet transfusion, and consideration of emergent  splenectomy.  2. Pulmonary:  Patient notes history of rheumatoid involvement in lung.  She presented with a dry cough in 2010.  She developed pneumonia.  She notes "a ground glass appearance to her lungs).  She underwent lung biopsy and told interstitial lung disease and pulmonary fibrosis.  She was intubated x 2.  She describes "massive steroids 500".  She was subsequently on prednisone x 2 months then sulfasalazine.  Admission chest CT angiogram revealed patchy bilateral hazy nodular airspace process likely due to infection.  She is on azithromycin and ceftriaxone.  CXR yesterday revealed question slightly increased mild interstitial and probable airspace opacities bilaterally.  Discussed with radiology.  Changes could be related to pulmonary hemorrhage.   Rosey Bath, MD  05/06/2018, 4:22 PM

## 2018-05-06 NOTE — Care Management Important Message (Signed)
Important Message  Patient Details  Name: Natasha Shaffer MRN: [id number] Date of Birth: 1946-09-27   Medicare Important Message Given:  Yes    Olegario Messier A Zaela Graley 05/06/2018, 10:29 AM

## 2018-05-06 NOTE — Progress Notes (Signed)
Per Dr. Merlene Pulling to transfuse platelets in  two hrs. Continue to monitor

## 2018-05-06 NOTE — Consult Note (Addendum)
PULMONARY CONSULT NOTE  Requesting MD/Service: Merlene Pulling Date of initial consultation: 05/06/18 Reason for consultation: hemoptysis   HPI:  72 y.o. 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  She has received platelet transfusion without the expected increase in plt counts. She has been on methylpred since admission and has received IVIG. Dr Merlene Pulling has noted new O2 requirement and persistent hemoptysis and therefore requests pulmonary consultation.   2010, she suffered a severe respiratory illness that required intubation and approximately 3 weeks of hospitalization.  At that time, she was diagnosed with "rheumatoid lung".  However, she has never had symptoms or exam findings consistent with rheumatoid arthritis.  At that time, she was treated with "high-dose steroids" which she did not tolerate well (caused hallucinations).  She ultimately underwent rehabilitation and was tapered off of corticosteroids.  She has been followed closely by pulmonologist with annual pulmonary function tests which are reportedly normal.  She feels that her exercise tolerance is normal.  She exercises on a regular basis.   She has traveled here from [place] for a friend's 50th wedding anniversary.  She took a fall in the airport with significant bruising of her extremities.  She did not experience head trauma or chest trauma.  She did not have any respiratory problems or symptoms prior to her acute hospitalization.  However, on Saturday morning, she did note scant blood on her lips when she awoke.  She has also noted a petechial rash on her lower extremities for several weeks or months.  Presently, she is dyspneic with minimal exertion but appears fine at rest and is able to speak in full sentences.  She denies chest pain.  Purulent sputum, fever, lower  extremity edema, calf tenderness.  She does report tremor which she notes is worse after receiving nebulized albuterol.  Past Medical History:  Diagnosis Date  . Interstitial lung disease (HCC)   . Pulmonary fibrosis (HCC)   . Rheumatoid arthritis [facility])     Past Surgical History:  Procedure Laterality Date  . LIGATE FALLOPIAN TUBE    . REPLACEMENT TOTAL KNEE BILATERAL    . TONSILLECTOMY      MEDICATIONS: I have reviewed all medications and confirmed regimen as documented  Social History   Socioeconomic History  . Marital status: Widowed    Spouse name: Not on file  . Number of children: Not on file  . Years of education: Not on file  . Highest education level: Not on file  Occupational History  . Not on file  Social Needs  . Financial resource strain: Not hard at all  . Food insecurity:    Worry: Never true    Inability: Never true  . Transportation needs:    Medical: No    Non-medical: No  Tobacco Use  . Smoking status: Former Smoker    Packs/day: 0.25    Years: 3.00    Pack years: 0.75    Types: Cigarettes    Start date: 12/23/2001    Last attempt to quit: 10/03/2005    Years since quitting: 12.5  . Smokeless tobacco: Never Used  Substance and Sexual Activity  . Alcohol use: Not on file  . Drug use: Not on file  . Sexual activity: Not Currently  Lifestyle  . Physical activity:    Days per week: Patient refused    Minutes per  session: Patient refused  . Stress: To some extent  Relationships  . Social connections:    Talks on phone: More than three times a week    Gets together: Twice a week    Attends religious service: 1 to 4 times per year    Active member of club or organization: No    Attends meetings of clubs or organizations: Never    Relationship status: Widowed  . Intimate partner violence:    Fear of current or ex partner: No    Emotionally abused: No    Physically abused: No    Forced sexual activity: No  Other Topics Concern  . Not on  file  Social History Narrative  . Not on file    Family History  Problem Relation Age of Onset  . Renal cancer Mother   . Prostate cancer Father   . Heart failure Father     ROS: No fever, myalgias/arthralgias, unexplained weight loss or weight gain No new focal weakness or sensory deficits No otalgia, hearing loss, visual changes, nasal and sinus symptoms, mouth and throat problems No neck pain or adenopathy No abdominal pain, N/V/D, diarrhea, change in bowel pattern No dysuria, change in urinary pattern, no hematuria   Vitals:   05/06/18 0151 05/06/18 0340 05/06/18 0754 05/06/18 1318  BP:  (!) 143/79  (!) 147/88  Pulse:  94  100  Resp:  15  18  Temp:  98.7 F (37.1 C)  98.3 F (36.8 C)  TempSrc:  Oral  Oral  SpO2: 95% 94% (!) 85% 94%  Weight:      Height:         EXAM:  Gen: WDWN, dyspneic appearing with mild exertion on [place] O2 HEENT: NCAT, sclera white, oropharynx normal  Neck: Supple without LAN, thyromegaly, JVD Lungs: Percussion normal, bibasilar crackles, no wheezes Cardiovascular: RRR, no murmurs noted Abdomen: Soft, nontender, normal BS Ext: without clubbing, cyanosis, edema Neuro: CNs grossly intact, motor and sensory intact Skin: Ecchymoses on all extremities.  Petechiae on BLE  DATA:   BMP Latest Ref Rng & Units 05/03/2018  Glucose 65 - 99 mg/dL 161(W)  BUN 6 - 20 mg/dL 17  Creatinine 9.60 - 4.54 mg/dL 0.98  Sodium 119 - 147 mmol/L 141  Potassium 3.5 - 5.1 mmol/L 3.8  Chloride 101 - 111 mmol/L 113(H)  CO2 22 - 32 mmol/L 22  Calcium 8.9 - 10.3 mg/dL 8.2(N)    CBC Latest Ref Rng & Units 05/06/2018 05/05/2018 05/04/2018  WBC 3.6 - 11.0 K/uL 14.2(H) 10.4 -  Hemoglobin 12.0 - 16.0 g/dL 10.5(L) 10.7(L) -  Hematocrit 35.0 - 47.0 % 31.9(L) 31.5(L) -  Platelets 150 - 440 K/uL <5(LL) <5(LL) <5(LL)   CT chest: 06/22: patchy bilateral L>R GGOs. Minimal subpleural reticulations in bases  CXR 06/24:  Patchy B AS dz. 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  The only way to determine definitively would be to subject her to a surgical lung biopsy.   PLAN:  I discussed the following plan with Dr. Merlene Pulling and with the patient:  Methylprednisolone 125 mg IV once now, then increase methylprednisolone to 80 mg every 12 hours.  I have ordered  to monitor her CBGs while on corticosteroids.  In absence of wheezing, change albuterol to as needed only (due to tremor)  Further management of ITP per Dr. Merlene Pulling  I concur with transfer to [facility] as planned  Ultimately, she will need a follow-up chest x-ray in 3 to 4 weeks which will probably need to be done by her pulmonologist in [place]  It would be beneficial to her subsequent care providers for her to have a copy of her CT chest and current chest x-ray on a CD for her to take back with her to [place]  If there is any further deterioration in her respiratory status, please obtain a repeat chest x-ray and contact us for follow-up.  Otherwise,PCCM will sign off. Please call if we can be of further assistance  Billy Fischer, MD PCCM service Mobile [phone] Pager [phone] 05/06/2018 5:20 PM

## 2018-05-06 NOTE — Progress Notes (Signed)
Patient ID: Natasha Shaffer, female   DOB: [birth year], 72 y.o.   MRN: [id number]  [employer] [facility] PROGRESS NOTE  Natasha Shaffer PXT:[id number] DOB: 12-09-1945 DOA: 05/03/2018 PCP: System, Provider Not In  HPI/Subjective: Patient had some headache this am.  Still with a little cough.  Slight blood-tinged.  Objective: Vitals:   05/06/18 0754 05/06/18 1318  BP:  (!) 147/88  Pulse:  100  Resp:  18  Temp:  98.3 F (36.8 C)  SpO2: (!) 85% 94%    Filed Weights   05/03/18 1039 05/03/18 1438  Weight: 92.1 kg (203 lb) 94.3 kg (208 lb)    ROS: Review of Systems  Constitutional: Negative for chills and fever.  Eyes: Negative for blurred vision.  Respiratory: Positive for cough, hemoptysis and shortness of breath.   Cardiovascular: Negative for chest pain.  Gastrointestinal: Negative for abdominal pain, constipation, diarrhea, nausea and vomiting.  Genitourinary: Negative for dysuria.  Musculoskeletal: Negative for joint pain.  Neurological: Positive for headaches. Negative for dizziness.   Exam: Physical Exam  Constitutional: She is oriented to person, place, and time.  HENT:  Nose: No mucosal edema.  Mouth/Throat: No oropharyngeal exudate or posterior oropharyngeal edema.  Eyes: Pupils are equal, round, and reactive to light. Conjunctivae, EOM and lids are normal.  Neck: No JVD present. Carotid bruit is not present. No edema present. No thyroid mass and no thyromegaly present.  Cardiovascular: S1 normal and S2 normal. Exam reveals no gallop.  No murmur heard. Pulses:      Dorsalis pedis pulses are 2+ on the right side, and 2+ on the left side.  Respiratory: No respiratory distress. She has decreased breath sounds in the right lower field and the left lower field. She has no wheezes. She has rhonchi in the right lower field and the left lower field. She has no rales.  GI: Soft. Bowel sounds are normal. There is no tenderness.  Musculoskeletal:       Right shoulder: She exhibits no  swelling.  Lymphadenopathy:    She has no cervical adenopathy.  Neurological: She is alert and oriented to person, place, and time. No cranial nerve deficit.  Skin: Skin is warm. Nails show no clubbing.  Petechiae lower extremities.  Bruising right hip and right elbow.  Some small bruises on the legs and arms.  Psychiatric: She has a normal mood and affect.      Data Reviewed: Basic Metabolic Panel: Recent Labs  Lab 05/03/18 1050  NA 141  K 3.8  CL 113*  CO2 22  GLUCOSE 115*  BUN 17  CREATININE 0.83  CALCIUM 8.5*   Liver Function Tests: Recent Labs  Lab 05/03/18 1050  AST 28  ALT 20  ALKPHOS 71  BILITOT 0.7  PROT 6.3*  ALBUMIN 4.0   CBC: Recent Labs  Lab 05/03/18 1209 05/04/18 0449 05/04/18 1444 05/05/18 0348 05/06/18 0338  WBC 10.1 11.0  --  10.4 14.2*  NEUTROABS 7.7*  --   --   --  11.0*  HGB 12.5 11.2*  --  10.7* 10.5*  HCT 37.4 33.7*  --  31.5* 31.9*  MCV 88.5 88.2  --  89.2 89.5  PLT <5* 5* <5* <5* <5*     Studies: Dg Chest 2 View  Result Date: 05/05/2018 CLINICAL DATA:  Cough and chest pain. EXAM: CHEST - 2 VIEW COMPARISON:  05/03/2018 chest CT and radiographs FINDINGS: Mild cardiomegaly again noted. Mild interstitial and probable airspace opacities may be slightly increased from recent chest radiograph. No  pneumothorax or definite pleural effusion. No acute bony abnormality identified. RIGHT sided pulse degenerating device again noted. IMPRESSION: Question slightly increased mild interstitial and probable airspace opacities bilaterally. No other significant change. Electronically Signed   By: Harmon Pier M.D.   On: 05/05/2018 09:21   Ct Head Wo Contrast  Result Date: 05/06/2018 CLINICAL DATA:  Posterior headaches for 3 days.  Low platelet level. EXAM: CT HEAD WITHOUT CONTRAST TECHNIQUE: Contiguous axial images were obtained from the base of the skull through the vertex without intravenous contrast. COMPARISON:  None. FINDINGS: Brain: The brainstem,  cerebellum, cerebral peduncles, thalami, basal ganglia, basilar cisterns, and ventricular system appear within normal limits. No intracranial hemorrhage, mass lesion, or acute CVA. Vascular: Unremarkable Skull: Unremarkable Sinuses/Orbits: Unremarkable where included. Other: Degenerative spurring of both mandibular condyles. IMPRESSION: 1. No significant intracranial abnormalities. 2. Degenerative spurring of both mandibular condyles. Electronically Signed   By: Gaylyn Rong M.D.   On: 05/06/2018 07:37   US Spleen (abdomen Limited)  Result Date: 05/04/2018 CLINICAL DATA:  Thrombocytopenia. EXAM: ULTRASOUND ABDOMEN LIMITED COMPARISON:  None. FINDINGS: Spleen measures 8.6 x 7.3 x 6.1 cm. This is within normal limits. No focal abnormality is noted. IMPRESSION: Normal size and appearance of spleen. Electronically Signed   By: Lupita Raider, M.D.   On: 05/04/2018 19:22    Scheduled Meds: . albuterol  2.5 mg Nebulization Q6H  . atorvastatin  80 mg Oral q1800  . azithromycin  250 mg Oral Daily  . buPROPion  300 mg Oral Daily  . DULoxetine  60 mg Oral Daily  . gabapentin  600 mg Oral BID  . methylPREDNISolone (SOLU-MEDROL) injection  1 mg/kg Intravenous Daily  . pantoprazole  40 mg Oral Daily  . rOPINIRole  4 mg Oral QHS  . sodium chloride flush  3 mL Intravenous Q12H  . topiramate  50 mg Oral BID  . traZODone  50 mg Oral QHS  . vitamin B-12  1,000 mcg Oral Daily   Continuous Infusions: . sodium chloride Stopped (05/04/18 0036)  . cefTRIAXone (ROCEPHIN)  IV Stopped (05/06/18 1031)    Assessment/Plan:  1. Acute ITP with severe thrombocytopenia.  Patient ordered daily dosing of Solu-Medrol and also received immunoglobulin.  Hematology following.  Check platelet count on a daily basis.  As per hematology no schistocytes seen on peripheral smear.  Platelet count still less than 5.  2. Acute hypoxic respiratory failure.  Oxygen ordered.  Looking back at CT scan of the chest they could not  rule out pneumonia at that time. Start Rocephin and Zithromax.  Contine nebulizer treatments. 3. History of rheumatoid arthritis and pulmonary fibrosis and interstitial lung disease. 4. GERD on Protonix 5. Restless leg syndrome on Requip 6. Anxiety depression continue psychiatric medications  Code Status:     Code Status Orders  (From admission, onward)        Start     Ordered   05/03/18 1432  Full code  Continuous     05/03/18 1431    Code Status History    This patient has a current code status but no historical code status.    Advance Directive Documentation     Most Recent Value  Type of Advance Directive  Healthcare Power of Attorney, Living will  Pre-existing out of facility DNR order (yellow form or pink MOST form)  -  "MOST" Form in Place?  -     Disposition Plan: Platelet count will have to be higher upon disposition.  Consultants:  Hematology  Time spent: 26 minutes.  Tried To call Dr Herbert Seta and left message [phone].  Their health system is on the epic system and I have access to their notes and they should have access to ours.  Areli Frary [employer]

## 2018-05-06 NOTE — Plan of Care (Signed)
  Problem: Education: Goal: Knowledge of General Education information will improve Outcome: Progressing   Problem: Health Behavior/Discharge Planning: Goal: Ability to manage health-related needs will improve Outcome: Progressing   Problem: Clinical Measurements: Goal: Will remain free from infection Outcome: Progressing Goal: Diagnostic test results will improve Outcome: Progressing Goal: Respiratory complications will improve Outcome: Progressing Goal: Cardiovascular complication will be avoided Outcome: Progressing   Problem: Activity: Goal: Risk for activity intolerance will decrease Outcome: Progressing   Problem: Nutrition: Goal: Adequate nutrition will be maintained Outcome: Progressing   Problem: Coping: Goal: Level of anxiety will decrease Outcome: Progressing   Problem: Elimination: Goal: Will not experience complications related to bowel motility Outcome: Progressing Goal: Will not experience complications related to urinary retention Outcome: Progressing   Problem: Pain Managment: Goal: General experience of comfort will improve Outcome: Progressing   Problem: Safety: Goal: Ability to remain free from injury will improve Outcome: Progressing   Problem: Skin Integrity: Goal: Risk for impaired skin integrity will decrease Outcome: Progressing

## 2018-05-07 ENCOUNTER — Ambulatory Visit (HOSPITAL_COMMUNITY)
Admission: AD | Admit: 2018-05-07 | Discharge: 2018-05-07 | Disposition: A | Discharge status: Home / Self Care | Payer: Medicare HMO | Source: Other Acute Inpatient Hospital | Attending: Internal Medicine | Admitting: Internal Medicine

## 2018-05-07 DIAGNOSIS — J8489 Other specified interstitial pulmonary diseases: Secondary | ICD-10-CM | POA: Insufficient documentation

## 2018-05-07 LAB — ENA+DNA/DS+SJORGEN'S
ENA SM Ab Ser-aCnc: <0.2 AI (ref 0.0–0.9)
Ribonucleic Protein: 1.2 AI — ABNORMAL HIGH (ref 0.0–0.9)
SSA (Ro) (ENA) Antibody, IgG: 3.5 AI — ABNORMAL HIGH (ref 0.0–0.9)
SSB (La) (ENA) Antibody, IgG: 0.2 AI (ref 0.0–0.9)
ds DNA Ab: 4 IU/mL (ref 0–9)

## 2018-05-07 LAB — PREPARE PLATELET PHERESIS: UNIT DIVISION: 0

## 2018-05-07 LAB — BPAM PLATELET PHERESIS
Blood Product Expiration Date: 201906262305
ISSUE DATE / TIME: 201906252336
Unit Type and Rh: 5100

## 2018-05-07 LAB — HEPATITIS B CORE ANTIBODY, TOTAL: Hep B Core Total Ab: POSITIVE — AB

## 2018-05-07 LAB — ANA W/REFLEX: Anti Nuclear Antibody(ANA): POSITIVE — AB

## 2018-05-07 MED ORDER — SODIUM CHLORIDE 0.9 % IV SOLN
INTRAVENOUS | Status: DC
Start: ? — End: 2018-05-07

## 2018-05-07 MED ORDER — DULOXETINE HCL 60 MG PO CPEP
60.00 | ORAL_CAPSULE | ORAL | Status: DC
Start: 2018-05-08 — End: 2018-05-07

## 2018-05-07 MED ORDER — GENERIC EXTERNAL MEDICATION
16.00 | Status: DC
Start: ? — End: 2018-05-07

## 2018-05-07 MED ORDER — ALBUTEROL SULFATE (2.5 MG/3ML) 0.083% IN NEBU: 2.5000 mg | INHALATION_SOLUTION | Freq: Four times a day (QID) | RESPIRATORY_TRACT | 12 refills | Status: AC | PRN

## 2018-05-07 MED ORDER — GUAIFENESIN 100 MG/5ML PO SYRP
5.00 | ORAL_SOLUTION | ORAL | Status: DC
Start: ? — End: 2018-05-07

## 2018-05-07 MED ORDER — DOCUSATE SODIUM 50 MG PO CAPS
50.00 | ORAL_CAPSULE | ORAL | Status: DC
Start: ? — End: 2018-05-07

## 2018-05-07 MED ORDER — VITAMIN B-12 1000 MCG PO TABS
1000.00 | ORAL_TABLET | ORAL | Status: DC
Start: 2018-05-08 — End: 2018-05-07

## 2018-05-07 MED ORDER — GABAPENTIN 300 MG PO CAPS
600.00 | ORAL_CAPSULE | ORAL | Status: DC
Start: 2018-05-07 — End: 2018-05-07

## 2018-05-07 MED ORDER — DEXTROSE 10 % IV SOLN
12.50 | INTRAVENOUS | Status: DC
Start: ? — End: 2018-05-07

## 2018-05-07 MED ORDER — METHYLPREDNISOLONE SODIUM SUCC 125 MG IJ SOLR: 80.0000 mg | Freq: Two times a day (BID) | INTRAMUSCULAR | 0 refills | Status: AC

## 2018-05-07 MED ORDER — TRAZODONE HCL 50 MG PO TABS
50.00 | ORAL_TABLET | ORAL | Status: DC
Start: 2018-05-07 — End: 2018-05-07

## 2018-05-07 MED ORDER — ROPINIROLE HCL 2 MG PO TABS
4.00 | ORAL_TABLET | ORAL | Status: DC
Start: 2018-05-07 — End: 2018-05-07

## 2018-05-07 MED ORDER — BUPROPION HCL ER (XL) 300 MG PO TB24
300.00 | ORAL_TABLET | ORAL | Status: DC
Start: 2018-05-08 — End: 2018-05-07

## 2018-05-07 MED ORDER — DEXAMETHASONE SODIUM PHOSPHATE 4 MG/ML IJ SOLN
40.00 | INTRAMUSCULAR | Status: DC
Start: 2018-05-07 — End: 2018-05-07

## 2018-05-07 MED ORDER — ACETAMINOPHEN 325 MG PO TABS
650.00 | ORAL_TABLET | ORAL | Status: DC
Start: ? — End: 2018-05-07

## 2018-05-07 MED ORDER — TOPIRAMATE 25 MG PO TABS
50.00 | ORAL_TABLET | ORAL | Status: DC
Start: 2018-05-07 — End: 2018-05-07

## 2018-05-07 MED ORDER — INSULIN REGULAR HUMAN 100 UNIT/ML IJ SOLN
0.00 | INTRAMUSCULAR | Status: DC
Start: 2018-05-07 — End: 2018-05-07

## 2018-05-07 MED ORDER — PANTOPRAZOLE SODIUM 40 MG PO TBEC
40.00 | DELAYED_RELEASE_TABLET | ORAL | Status: DC
Start: 2018-05-08 — End: 2018-05-07

## 2018-05-07 MED ORDER — GENERIC EXTERNAL MEDICATION
1.00 | Status: DC
Start: ? — End: 2018-05-07

## 2018-05-07 MED ORDER — ATORVASTATIN CALCIUM 80 MG PO TABS
80.00 | ORAL_TABLET | ORAL | Status: DC
Start: 2018-05-07 — End: 2018-05-07

## 2018-05-07 MED ORDER — BISACODYL 5 MG PO TBEC
5.00 | DELAYED_RELEASE_TABLET | ORAL | Status: DC
Start: ? — End: 2018-05-07

## 2018-05-07 MED ORDER — ALBUTEROL SULFATE (2.5 MG/3ML) 0.083% IN NEBU
2.50 | INHALATION_SOLUTION | RESPIRATORY_TRACT | Status: DC
Start: ? — End: 2018-05-07

## 2018-05-07 NOTE — Progress Notes (Signed)
Pt was transported to [facility] by [facility]. Per MD's order. Pt was transported on 2L of O2. No distress noted. Continue to monitor

## 2018-05-07 NOTE — Progress Notes (Signed)
Pt has orders for transport to [facility]. Received a call earlier regarding room availability, room # [phone]. Spoke with [facility] regarding transport to [facility]. [facility] stated that they'll put her in their list. Continue to monitor.

## 2018-05-07 NOTE — Progress Notes (Signed)
Spoke with [place], [place] from [place]. To give her report. Spoke with [facility], they stated that they were on their way. Continue to monitor.

## 2018-05-07 NOTE — Discharge Summary (Signed)
[facility] - [place] [facility]   PATIENT NAME: Natasha Shaffer    MR#:  [id number]  DATE OF BIRTH:  11-02-1946  DATE OF ADMISSION:  05/03/2018 ADMITTING PHYSICIAN: Shaune Pollack, MD  DATE OF DISCHARGE: 05/07/2018 at 3 AM to [facility]  PRIMARY CARE PHYSICIAN: System, Provider Not In    ADMISSION DIAGNOSIS:  Thrombocytopenia (HCC) [D69.6] Hemoptysis [R04.2]  DISCHARGE DIAGNOSIS:  Active Problems:   Thrombocytopenia (HCC)   Hemoptysis   Acute ITP (HCC)   SECONDARY DIAGNOSIS:   Past Medical History:  Diagnosis Date  . Interstitial lung disease (HCC)   . Pulmonary fibrosis (HCC)   . Rheumatoid arthritis (HCC)     HOSPITAL COURSE:   1.  Acute ITP with severe thrombocytopenia.  The patient was given daily dosing of high-dose Solu-Medrol and also received immunoglobulin.  Platelet count has not responded to current treatment.  Oncology recommended transfer to tertiary care center for possible Rituxan and other managements.  Tertiary care center can consider splenectomy if needed. 2.  Acute hypoxic respiratory failure.  Oxygen ordered.  CT scan of the chest could not rule out pneumonia.  This could also be pulmonary hemorrhage secondary to low platelets.  Patient empirically on Rocephin and Zithromax.  On nebulizer treatments. 3.  History of rheumatoid arthritis and pulmonary fibrosis with interstitial lung disease. 4.  GERD on Protonix 5.  Restless leg syndrome on Requip 6.  Anxiety depression on psychiatric medication.  DISCHARGE CONDITIONS:   Fair  CONSULTS OBTAINED:  Treatment Team:  Rickard Patience, MD  DRUG ALLERGIES:   Allergies  Allergen Reactions  . Codeine Other (See Comments)    "feels bad"  . Etomidate Other (See Comments)    "muscle twitches"    DISCHARGE MEDICATIONS:   Allergies as of 05/07/2018      Reactions   Codeine Other (See Comments)   "feels bad"   Etomidate Other (See Comments)   "muscle twitches"      Medication List    STOP taking  these medications   meloxicam 15 MG tablet Commonly known as:  MOBIC   rOPINIRole 4 MG tablet Commonly known as:  REQUIP   sulfaSALAzine 500 MG tablet Commonly known as:  AZULFIDINE     TAKE these medications   albuterol (2.5 MG/3ML) 0.083% nebulizer solution Commonly known as:  PROVENTIL Take 3 mLs (2.5 mg total) by nebulization every 6 (six) hours as needed for wheezing or shortness of breath.   ALPRAZolam 0.25 MG tablet Commonly known as:  XANAX Take 1 tablet (0.25 mg total) by mouth 3 (three) times daily as needed for anxiety.   atorvastatin 80 MG tablet Commonly known as:  LIPITOR Take 80 mg by mouth at bedtime.   azithromycin 250 MG tablet Commonly known as:  ZITHROMAX Take 1 tablet (250 mg total) by mouth daily.   betamethasone valerate ointment 0.1 % Commonly known as:  VALISONE Apply 1 application topically at bedtime. Apply thin layer to affected areas on legs.   bisacodyl 5 MG EC tablet Commonly known as:  DULCOLAX Take 1 tablet (5 mg total) by mouth daily as needed for moderate constipation.   buPROPion 300 MG 24 hr tablet Commonly known as:  WELLBUTRIN XL Take 300 mg by mouth daily.   cefTRIAXone 1 g in sodium chloride 0.9 % 100 mL Inject 1 g into the vein daily.   DULoxetine 60 MG capsule Commonly known as:  CYMBALTA Take 60 mg by mouth daily.   gabapentin 600 MG tablet  Commonly known as:  NEURONTIN Take 600 mg by mouth 2 (two) times daily.   methylPREDNISolone sodium succinate 125 mg/2 mL injection Commonly known as:  SOLU-MEDROL Inject 1.28 mLs (80 mg total) into the vein every 12 (twelve) hours.   omeprazole 20 MG capsule Commonly known as:  PRILOSEC Take 20 mg by mouth daily.   topiramate 50 MG tablet Commonly known as:  TOPAMAX Take 50 mg by mouth 2 (two) times daily.   traMADol 50 MG tablet Commonly known as:  ULTRAM Take 1 tablet (50 mg total) by mouth every 6 (six) hours as needed for moderate pain.   traZODone 50 MG  tablet Commonly known as:  DESYREL Take 50 mg by mouth at bedtime.        DISCHARGE INSTRUCTIONS:   Discharge to [facility]   If you experience worsening of your admission symptoms, develop shortness of breath, life threatening emergency, suicidal or homicidal thoughts you must seek medical attention immediately by calling 911 or calling your MD immediately  if symptoms less severe.  You Must read complete instructions/literature along with all the possible adverse reactions/side effects for all the Medicines you take and that have been prescribed to you. Take any new Medicines after you have completely understood and accept all the possible adverse reactions/side effects.   Please note  You were cared for by a hospitalist during your hospital stay. If you have any questions about your discharge medications or the care you received while you were in the hospital after you are discharged, you can call the unit and asked to speak with the hospitalist on call if the hospitalist that took care of you is not available. Once you are discharged, your primary care physician will handle any further medical issues. Please note that NO REFILLS for any discharge medications will be authorized once you are discharged, as it is imperative that you return to your primary care physician (or establish a relationship with a primary care physician if you do not have one) for your aftercare needs so that they can reassess your need for medications and monitor your lab values.    Today   CHIEF COMPLAINT:   Chief Complaint  Patient presents with  . Hemoptysis    HISTORY OF PRESENT ILLNESS:  Natasha Shaffer  is a 72 y.o. female presented with hemoptysis   VITAL SIGNS:  Blood pressure (!) 169/95, pulse 80, temperature 98 F (36.7 C), temperature source Oral, resp. rate 18, height 5\' 5"  (1.651 m), weight 94.3 kg (208 lb), SpO2 92 %.      DATA REVIEW:   CBC Recent Labs  Lab 05/06/18 0338  WBC 14.2*  HGB  10.5*  HCT 31.9*  PLT <5*    Chemistries  Recent Labs  Lab 05/03/18 1050  NA 141  K 3.8  CL 113*  CO2 22  GLUCOSE 115*  BUN 17  CREATININE 0.83  CALCIUM 8.5*  AST 28  ALT 20  ALKPHOS 71  BILITOT 0.7     RADIOLOGY:  Dg Chest 2 View  Result Date: 05/05/2018 CLINICAL DATA:  Cough and chest pain. EXAM: CHEST - 2 VIEW COMPARISON:  05/03/2018 chest CT and radiographs FINDINGS: Mild cardiomegaly again noted. Mild interstitial and probable airspace opacities may be slightly increased from recent chest radiograph. No pneumothorax or definite pleural effusion. No acute bony abnormality identified. RIGHT sided pulse degenerating device again noted. IMPRESSION: Question slightly increased mild interstitial and probable airspace opacities bilaterally. No other significant change. Electronically Signed  By: Harmon Pier M.D.   On: 05/05/2018 09:21   Ct Head Wo Contrast  Result Date: 05/06/2018 CLINICAL DATA:  Posterior headaches for 3 days.  Low platelet level. EXAM: CT HEAD WITHOUT CONTRAST TECHNIQUE: Contiguous axial images were obtained from the base of the skull through the vertex without intravenous contrast. COMPARISON:  None. FINDINGS: Brain: The brainstem, cerebellum, cerebral peduncles, thalami, basal ganglia, basilar cisterns, and ventricular system appear within normal limits. No intracranial hemorrhage, mass lesion, or acute CVA. Vascular: Unremarkable Skull: Unremarkable Sinuses/Orbits: Unremarkable where included. Other: Degenerative spurring of both mandibular condyles. IMPRESSION: 1. No significant intracranial abnormalities. 2. Degenerative spurring of both mandibular condyles. Electronically Signed   By: Gaylyn Rong M.D.   On: 05/06/2018 07:37      CODE STATUS:     Code Status Orders  (From admission, onward)        Start     Ordered   05/03/18 1432  Full code  Continuous     05/03/18 1431    Code Status History    This patient has a current code status but  no historical code status.    Advance Directive Documentation     Most Recent Value  Type of Advance Directive  Healthcare Power of Attorney, Living will  Pre-existing out of facility DNR order (yellow form or pink MOST form)  -  "MOST" Form in Place?  -      Alford Highland M.D on 05/07/2018 at 7:06 AM  Between 7am to 6pm - Pager - [phone]  After 6pm go to [email or web address] - password [email or web address]  [facility] Office  [phone]  CC: Primary care physician; System, Provider Not In

## 2019-10-20 IMAGING — CT CT HEAD W/O CM
3 series · 16 of 47 positions shown, 19 images · non-contrast
Comparison: None.

CLINICAL DATA: Posterior headaches for 3 days.  Low platelet level.

EXAM:
CT HEAD WITHOUT CONTRAST
TECHNIQUE: Contiguous axial images were obtained from the base of the skull
through the vertex without intravenous contrast.

[Series 2: head wo · axial · 0.42mm/px · z∈[-122,+3]mm · 10 of 31 slices shown, 13 images]
[im 3/31  brain]
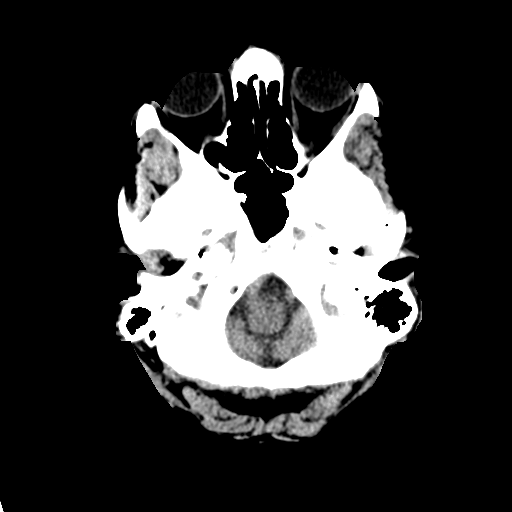
[im 3/31  bone]
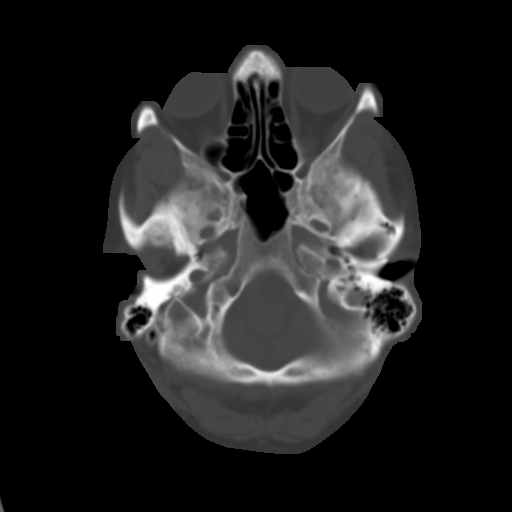
[im 6/31  brain]
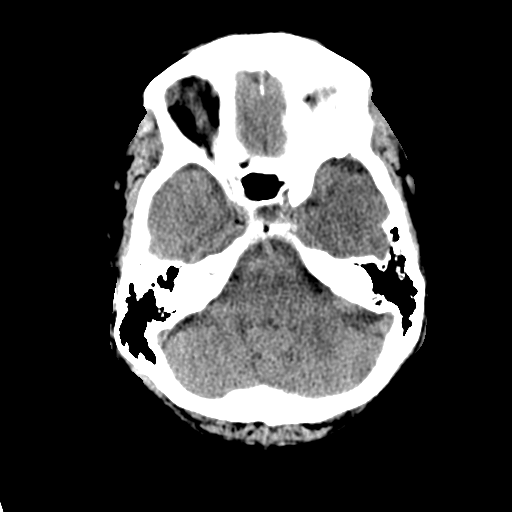
[im 9/31  brain]
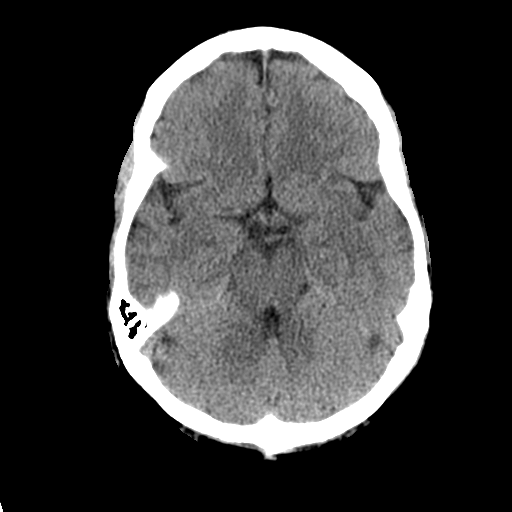
[im 11/31  brain]
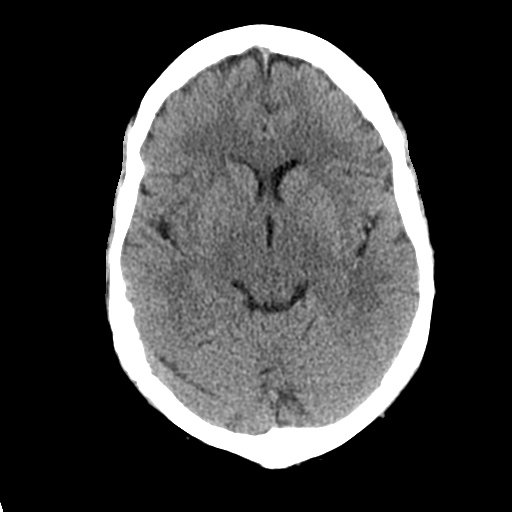
[im 14/31  brain]
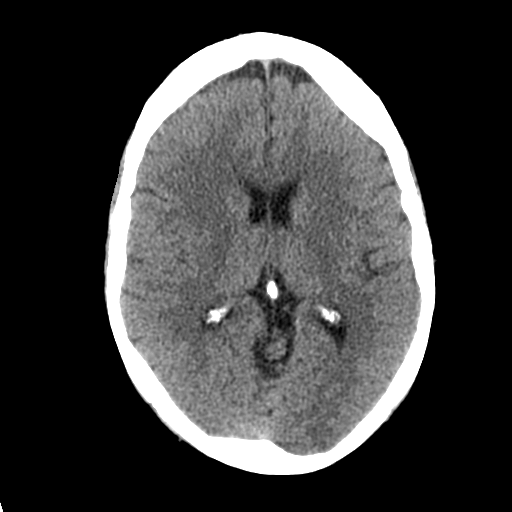
[im 14/31  bone]
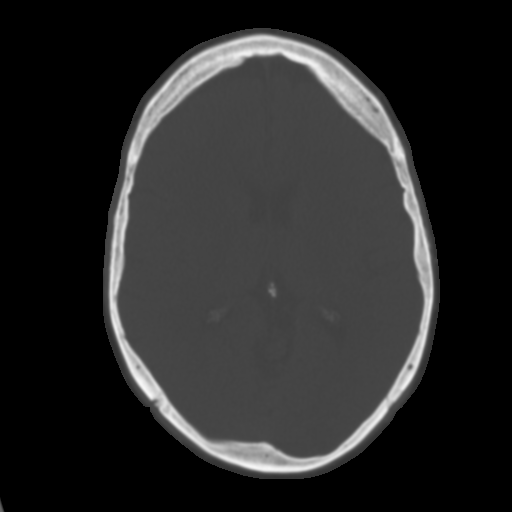
[im 17/31  brain]
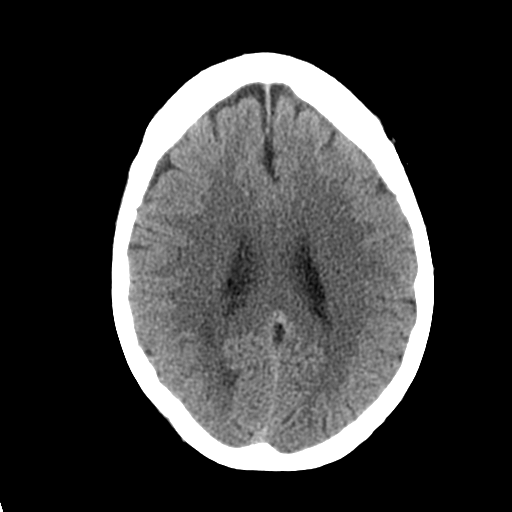
[im 20/31  brain]
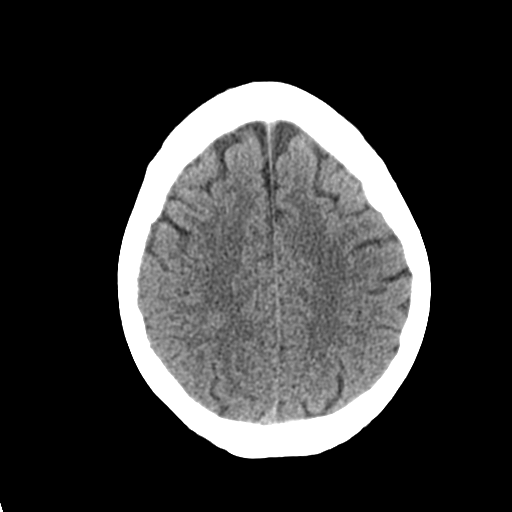
[im 23/31  brain]
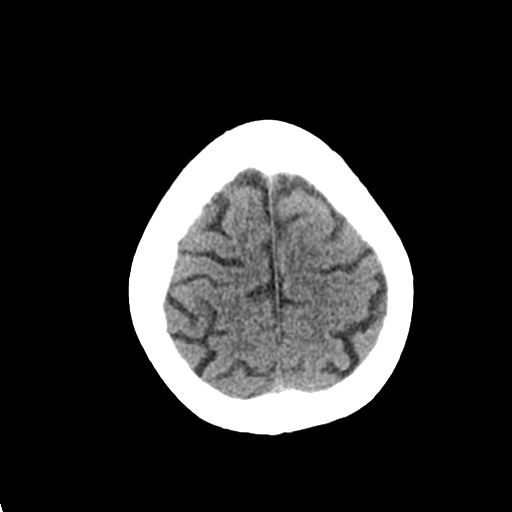
[im 25/31  brain]
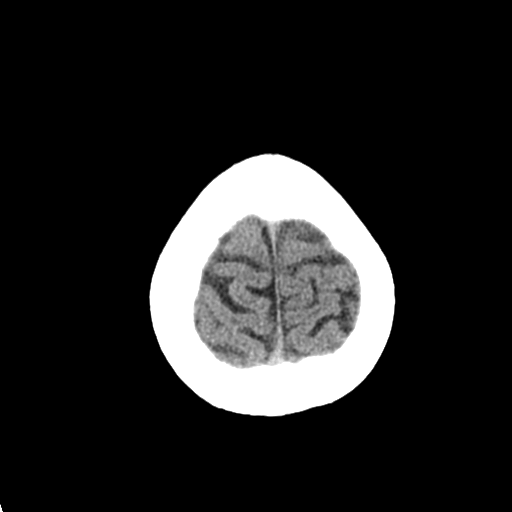
[im 25/31  bone]
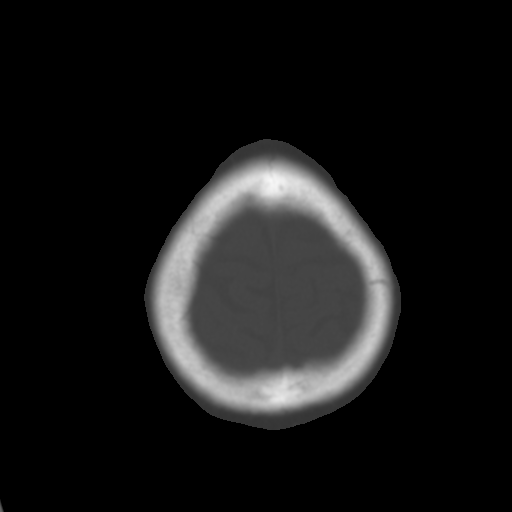
[im 28/31  brain]
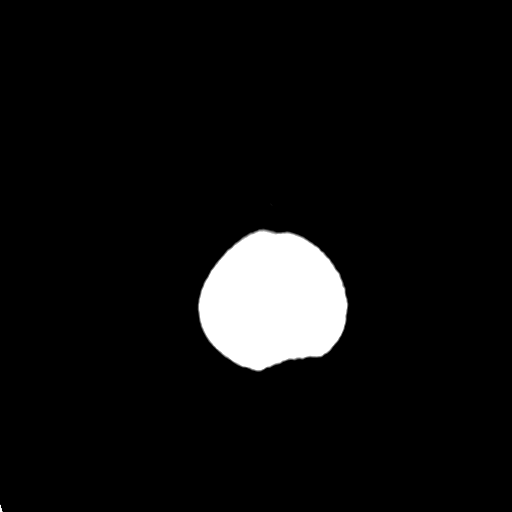

[Series 4: coronal soft tissue · coronal · 0.30mm/px · 3 of 63 slices shown]
[im 21/63  brain]
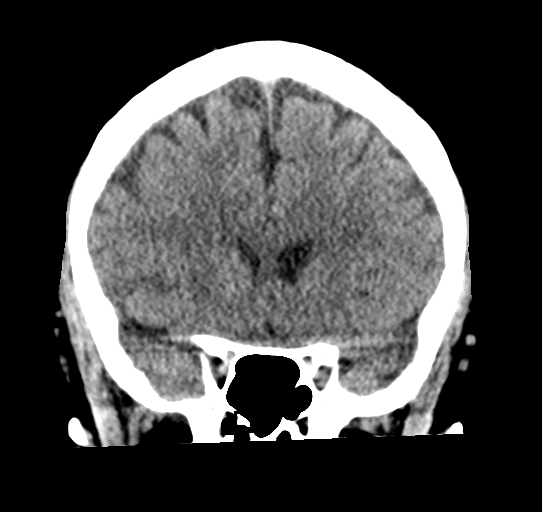
[im 28/63  brain]
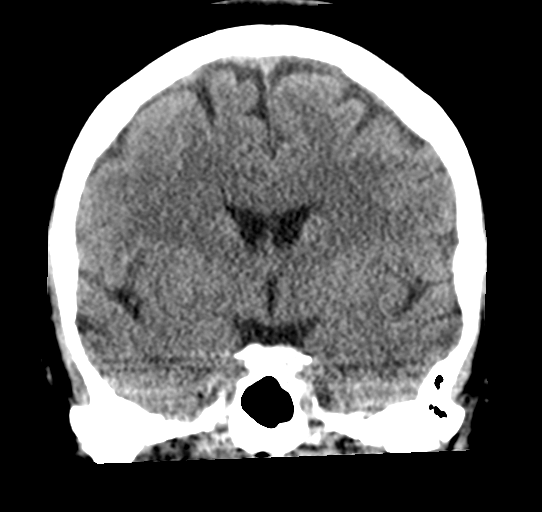
[im 35/63  brain]
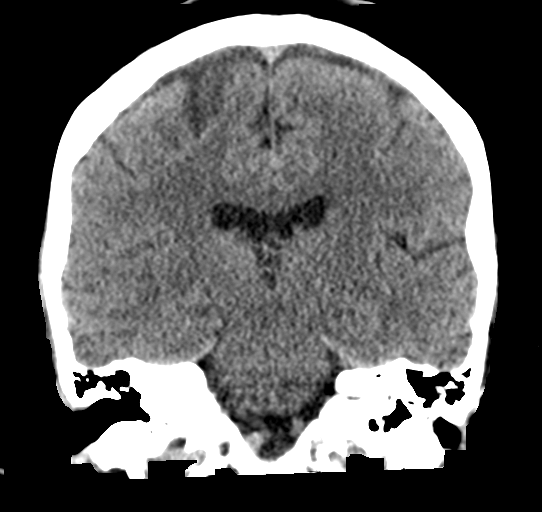

[Series 5: sagittal soft tissue · sagittal · 0.32mm/px · 3 of 49 slices shown]
[im 17/49  brain]
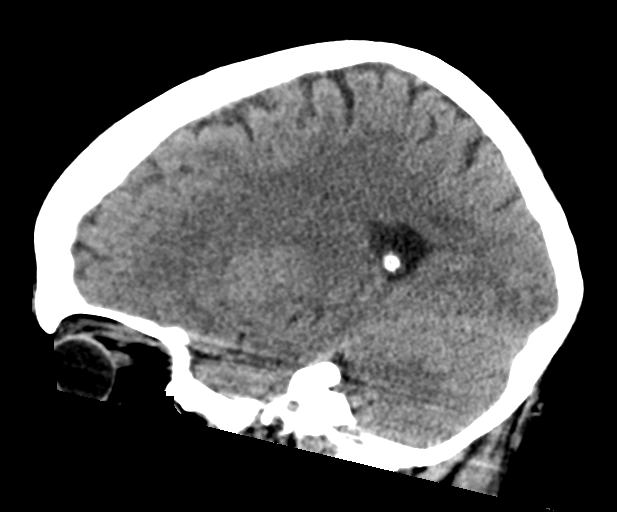
[im 25/49  brain]
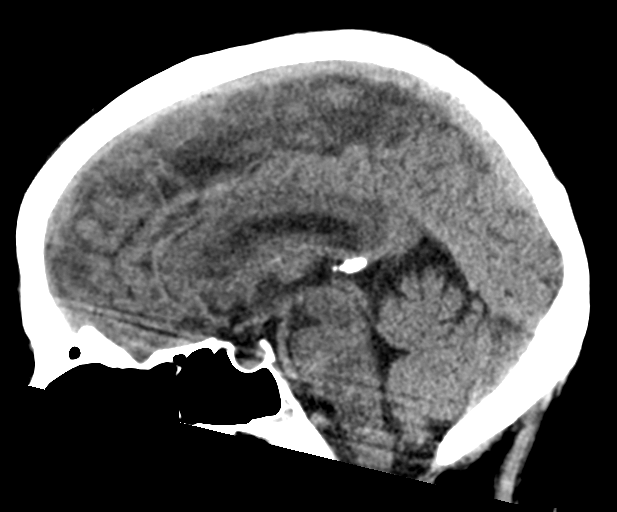
[im 33/49  brain]
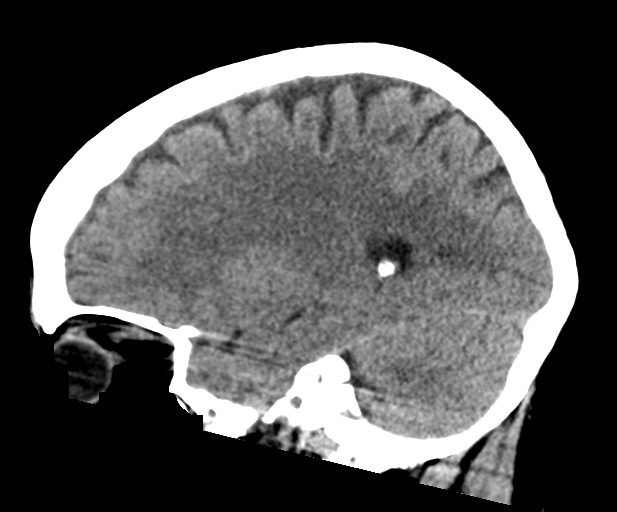

[16 of 47 positions shown; findings below may reference images not displayed]

FINDINGS: Brain: The brainstem, cerebellum, cerebral peduncles, thalami, basal
ganglia, basilar cisterns, and ventricular system appear within
normal limits. No intracranial hemorrhage, mass lesion, or acute
CVA.

Vascular: Unremarkable

Skull: Unremarkable

Sinuses/Orbits: Unremarkable where included.

Other: Degenerative spurring of both mandibular condyles.
IMPRESSION: 1. No significant intracranial abnormalities.
2. Degenerative spurring of both mandibular condyles.
